# Patient Record
Sex: Female | Born: 1990 | Race: White | Hispanic: No | Marital: Married | Smoking: Never smoker
Health system: Southern US, Community
[De-identification: ages and names within clinical notes are randomized; demographics above are authoritative.]

## PROBLEM LIST (undated history)

## (undated) DIAGNOSIS — K219 Gastro-esophageal reflux disease without esophagitis: Secondary | ICD-10-CM

## (undated) DIAGNOSIS — E079 Disorder of thyroid, unspecified: Secondary | ICD-10-CM

## (undated) DIAGNOSIS — E039 Hypothyroidism, unspecified: Secondary | ICD-10-CM

## (undated) DIAGNOSIS — I10 Essential (primary) hypertension: Secondary | ICD-10-CM

## (undated) HISTORY — DX: Disorder of thyroid, unspecified: E07.9

## (undated) HISTORY — PX: TONSILLECTOMY: SUR1361

## (undated) HISTORY — DX: Essential (primary) hypertension: I10

## (undated) HISTORY — PX: WISDOM TOOTH EXTRACTION: SHX21

---

## 2014-03-22 ENCOUNTER — Inpatient Hospital Stay: Payer: Self-pay | Admitting: Obstetrics and Gynecology

## 2015-04-11 DIAGNOSIS — Z124 Encounter for screening for malignant neoplasm of cervix: Secondary | ICD-10-CM | POA: Diagnosis not present

## 2015-04-11 DIAGNOSIS — Z309 Encounter for contraceptive management, unspecified: Secondary | ICD-10-CM | POA: Diagnosis not present

## 2015-04-11 DIAGNOSIS — J0100 Acute maxillary sinusitis, unspecified: Secondary | ICD-10-CM | POA: Diagnosis not present

## 2015-04-11 DIAGNOSIS — Z0001 Encounter for general adult medical examination with abnormal findings: Secondary | ICD-10-CM | POA: Diagnosis not present

## 2015-04-24 DIAGNOSIS — R300 Dysuria: Secondary | ICD-10-CM | POA: Diagnosis not present

## 2015-04-24 DIAGNOSIS — N390 Urinary tract infection, site not specified: Secondary | ICD-10-CM | POA: Diagnosis not present

## 2015-04-24 DIAGNOSIS — R87820 Cervical low risk human papillomavirus (HPV) DNA test positive: Secondary | ICD-10-CM | POA: Diagnosis not present

## 2015-11-10 DIAGNOSIS — R10817 Generalized abdominal tenderness: Secondary | ICD-10-CM | POA: Diagnosis not present

## 2015-11-10 DIAGNOSIS — K219 Gastro-esophageal reflux disease without esophagitis: Secondary | ICD-10-CM | POA: Diagnosis not present

## 2015-11-20 ENCOUNTER — Other Ambulatory Visit: Payer: Self-pay | Admitting: Nurse Practitioner

## 2015-11-20 ENCOUNTER — Ambulatory Visit
Admission: RE | Admit: 2015-11-20 | Discharge: 2015-11-20 | Disposition: A | Discharge status: Home / Self Care | Payer: BLUE CROSS/BLUE SHIELD | Source: Ambulatory Visit | Attending: Nurse Practitioner | Admitting: Nurse Practitioner

## 2015-11-20 DIAGNOSIS — R10817 Generalized abdominal tenderness: Secondary | ICD-10-CM | POA: Diagnosis not present

## 2015-11-20 DIAGNOSIS — K8020 Calculus of gallbladder without cholecystitis without obstruction: Secondary | ICD-10-CM | POA: Diagnosis not present

## 2015-11-24 ENCOUNTER — Encounter: Payer: Self-pay | Admitting: General Surgery

## 2015-11-24 ENCOUNTER — Ambulatory Visit (INDEPENDENT_AMBULATORY_CARE_PROVIDER_SITE_OTHER): Payer: BLUE CROSS/BLUE SHIELD | Admitting: General Surgery

## 2015-11-24 VITALS — BP 120/72 | HR 80 | Resp 12 | Ht 65.0 in | Wt 190.0 lb

## 2015-11-24 DIAGNOSIS — K8020 Calculus of gallbladder without cholecystitis without obstruction: Secondary | ICD-10-CM

## 2015-11-24 NOTE — Patient Instructions (Signed)
Laparoscopic Cholecystectomy Laparoscopic cholecystectomy is surgery to remove the gallbladder. The gallbladder is a pear-shaped organ that lies beneath the liver on the right side of the body. The gallbladder stores bile, which is a fluid that helps the body to digest fats. Cholecystectomy is often done for inflammation of the gallbladder (cholecystitis). This condition is usually caused by a buildup of gallstones (cholelithiasis) in the gallbladder. Gallstones can block the flow of bile, which can result in inflammation and pain. In severe cases, emergency surgery may be required. This procedure is done though small incisions in your abdomen (laparoscopic surgery). A thin scope with a camera (laparoscope) is inserted through one incision. Thin surgical instruments are inserted through the other incisions. In some cases, a laparoscopic procedure may be turned into a type of surgery that is done through a larger incision (open surgery). Tell a health care provider about:  Any allergies you have.  All medicines you are taking, including vitamins, herbs, eye drops, creams, and over-the-counter medicines.  Any problems you or family members have had with anesthetic medicines.  Any blood disorders you have.  Any surgeries you have had.  Any medical conditions you have.  Whether you are pregnant or may be pregnant. What are the risks? Generally, this is a safe procedure. However, problems may occur, including:  Infection.  Bleeding.  Allergic reactions to medicines.  Damage to other structures or organs.  A stone remaining in the common bile duct. The common bile duct carries bile from the gallbladder into the small intestine.  A bile leak from the cyst duct that is clipped when your gallbladder is removed. What happens before the procedure? Staying hydrated  Follow instructions from your health care provider about hydration, which may include:  Up to 2 hours before the procedure - you  may continue to drink clear liquids, such as water, clear fruit juice, black coffee, and plain tea. Eating and drinking restrictions  Follow instructions from your health care provider about eating and drinking, which may include:  8 hours before the procedure - stop eating heavy meals or foods such as meat, fried foods, or fatty foods.  6 hours before the procedure - stop eating light meals or foods, such as toast or cereal.  6 hours before the procedure - stop drinking milk or drinks that contain milk.  2 hours before the procedure - stop drinking clear liquids. Medicines   Ask your health care provider about:  Changing or stopping your regular medicines. This is especially important if you are taking diabetes medicines or blood thinners.  Taking medicines such as aspirin and ibuprofen. These medicines can thin your blood. Do not take these medicines before your procedure if your health care provider instructs you not to.  You may be given antibiotic medicine to help prevent infection. General instructions   Let your health care provider know if you develop a cold or an infection before surgery.  Plan to have someone take you home from the hospital or clinic.  Ask your health care provider how your surgical site will be marked or identified. What happens during the procedure?  To reduce your risk of infection:  Your health care team will wash or sanitize their hands.  Your skin will be washed with soap.  Hair may be removed from the surgical area.  An IV tube may be inserted into one of your veins.  You will be given one or more of the following:  A medicine to help you relax (  sedative).  A medicine to make you fall asleep (general anesthetic).  A breathing tube will be placed in your mouth.  Your surgeon will make several small cuts (incisions) in your abdomen.  The laparoscope will be inserted through one of the small incisions. The camera on the laparoscope will  send images to a TV screen (monitor) in the operating room. This lets your surgeon see inside your abdomen.  Air-like gas will be pumped into your abdomen. This will expand your abdomen to give the surgeon more room to perform the surgery.  Other tools that are needed for the procedure will be inserted through the other incisions. The gallbladder will be removed through one of the incisions.  Your common bile duct may be examined. If stones are found in the common bile duct, they may be removed.  After your gallbladder has been removed, the incisions will be closed with stitches (sutures), staples, or skin glue.  Your incisions may be covered with a bandage (dressing). The procedure may vary among health care providers and hospitals. What happens after the procedure?  Your blood pressure, heart rate, breathing rate, and blood oxygen level will be monitored until the medicines you were given have worn off.  You will be given medicines as needed to control your pain.  Do not drive for 24 hours if you were given a sedative. This information is not intended to replace advice given to you by your health care provider. Make sure you discuss any questions you have with your health care provider. Document Released: 12/21/2004 Document Revised: 07/13/2015 Document Reviewed: 06/09/2015 Elsevier Interactive Patient Education  2017 [employer]  

## 2015-11-24 NOTE — Progress Notes (Signed)
Patient ID: Miranda Briggs, female   DOB: [birth year], 25 y.o.   MRN: [id number]  Chief Complaint  Patient presents with  . Abdominal Pain    gallstones    HPI Miranda Briggs is a 25 y.o. female here for evaluation of gallstones. She had an ultrasound on 11/20/15. She reports that initially felt pain under her ribs, worse on right side radiating to the back. Abdominal pain is worse after eating fast food. She has had at least 3  flares in the past week. The attacks last up5hrs. She also nausea and vomiting associated with the pain I have reviewed the history of present illness with the patient.  HPI  History reviewed. No pertinent past medical history.  Past Surgical History:  Procedure Laterality Date  . TONSILLECTOMY      No family history on file.  Social History Social History  Substance Use Topics  . Smoking status: Never Smoker  . Smokeless tobacco: Never Used  . Alcohol use No    Allergies  Allergen Reactions  . Penicillins Hives    Current Outpatient Prescriptions  Medication Sig Dispense Refill  . LORYNA 3-0.02 MG tablet Take 1 tablet by mouth daily.  11   No current facility-administered medications for this visit.     Review of Systems Review of Systems  Constitutional: Negative.   Respiratory: Negative.   Cardiovascular: Negative.     Blood pressure 120/72, pulse 80, resp. rate 12, height 5\' 5"  (1.651 m), weight 190 lb (86.2 kg), last menstrual period 10/24/2015.  Physical Exam Physical Exam  Constitutional: She is oriented to person, place, and time. She appears well-developed and well-nourished.  HENT:  Mouth/Throat: Oropharynx is clear and moist.  Eyes: Conjunctivae are normal. No scleral icterus.  Neck: Neck supple.  Cardiovascular: Normal rate, regular rhythm and normal heart sounds.   Pulmonary/Chest: Effort normal and breath sounds normal.  Abdominal: Soft. Normal appearance and bowel sounds are normal. There is no hepatomegaly. There is  no tenderness.  Lymphadenopathy:    She has no cervical adenopathy.  Neurological: She is alert and oriented to person, place, and time.  Skin: Skin is warm and dry.    Data Reviewed Ultrasound showing multiple gallstones Assessment    Biliary colic due to cholelithiasis    Plan Recommended cholecystectomy-reasons, risks  and benefits explained  Patient is agreeable to laparoscopic cholecystectomy Labs ordered  Laparoscopic Cholecystectomy with Intraoperative Cholangiogram. The procedure, including it's potential risks and complications (including but not limited to infection, bleeding, injury to intra-abdominal organs or bile ducts, bile leak, poor cosmetic result, sepsis and death) were discussed with the patient in detail. Non-operative options, including their inherent risks (acute calculous cholecystitis with possible choledocholithiasis or gallstone pancreatitis, with the risk of ascending cholangitis, sepsis, and death) were discussed as well. The patient expressed and understanding of what we discussed and wishes to proceed with laparoscopic cholecystectomy. The patient further understands that if it is technically not possible, or it is unsafe to proceed laparoscopically, that I will convert to an open cholecystectomy.  Patient's surgery has been scheduled for 12-03-15 at [facility].    This has been scribed by Lesly Rubenstein LPN    [facility] G 11/25/2015, 5:55 AM

## 2015-11-25 ENCOUNTER — Encounter: Payer: Self-pay | Admitting: General Surgery

## 2015-11-25 LAB — CBC WITH DIFFERENTIAL/PLATELET
Basophils Absolute: 0 10*3/uL (ref 0.0–0.2)
Basos: 1 %
EOS (ABSOLUTE): 0.2 10*3/uL (ref 0.0–0.4)
EOS: 3 %
HEMATOCRIT: 36.5 % (ref 34.0–46.6)
HEMOGLOBIN: 13 g/dL (ref 11.1–15.9)
IMMATURE GRANULOCYTES: 0 %
Immature Grans (Abs): 0 10*3/uL (ref 0.0–0.1)
LYMPHS: 44 %
Lymphocytes Absolute: 3.7 10*3/uL — ABNORMAL HIGH (ref 0.7–3.1)
MCH: 31 pg (ref 26.6–33.0)
MCHC: 35.6 g/dL (ref 31.5–35.7)
MCV: 87 fL (ref 79–97)
MONOCYTES: 8 %
Monocytes Absolute: 0.6 10*3/uL (ref 0.1–0.9)
NEUTROS ABS: 3.5 10*3/uL (ref 1.4–7.0)
NEUTROS PCT: 44 %
PLATELETS: 359 10*3/uL (ref 150–379)
RBC: 4.2 x10E6/uL (ref 3.77–5.28)
RDW: 12.7 % (ref 12.3–15.4)
WBC: 8 10*3/uL (ref 3.4–10.8)

## 2015-11-25 LAB — LIPASE: Lipase: 40 U/L (ref 14–72)

## 2015-11-25 LAB — HEPATIC FUNCTION PANEL
ALBUMIN: 4.4 g/dL (ref 3.5–5.5)
ALT: 19 IU/L (ref 0–32)
AST: 17 IU/L (ref 0–40)
Alkaline Phosphatase: 68 IU/L (ref 39–117)
BILIRUBIN TOTAL: 0.7 mg/dL (ref 0.0–1.2)
BILIRUBIN, DIRECT: 0.19 mg/dL (ref 0.00–0.40)
Total Protein: 7.1 g/dL (ref 6.0–8.5)

## 2015-12-01 ENCOUNTER — Encounter
Admission: RE | Admit: 2015-12-01 | Discharge: 2015-12-01 | Disposition: A | Discharge status: Home / Self Care | Payer: BLUE CROSS/BLUE SHIELD | Source: Ambulatory Visit | Attending: General Surgery | Admitting: General Surgery

## 2015-12-01 HISTORY — DX: Gastro-esophageal reflux disease without esophagitis: K21.9

## 2015-12-01 NOTE — Patient Instructions (Signed)
  Your procedure is scheduled on: 12-03-15 Report to Same Day Surgery 2nd floor medical mall [facility] Entrance-take elevator on left to 2nd floor.  Check in with surgery information desk.) To find out your arrival time please call [phone] between 1PM - 3PM on 12-02-15  Remember: Instructions that are not followed completely may result in serious medical risk, up to and including death, or upon the discretion of your surgeon and anesthesiologist your surgery may need to be rescheduled.    _x___ 1. Do not eat food or drink liquids after midnight. No gum chewing or hard candies.     __x__ 2. No Alcohol for 24 hours before or after surgery.   __x__3. No Smoking for 24 prior to surgery.   ____  4. Bring all medications with you on the day of surgery if instructed.    __x__ 5. Notify your doctor if there is any change in your medical condition     (cold, fever, infections).     Do not wear jewelry, make-up, hairpins, clips or nail polish.  Do not wear lotions, powders, or perfumes. You may wear deodorant.  Do not shave 48 hours prior to surgery. Men may shave face and neck.  Do not bring valuables to the hospital.    [facility] is not responsible for any belongings or valuables.               Contacts, dentures or bridgework may not be worn into surgery.  Leave your suitcase in the car. After surgery it may be brought to your room.  For patients admitted to the hospital, discharge time is determined by your treatment team.   Patients discharged the day of surgery will not be allowed to drive home.  You will need someone to drive you home and stay with you the night of your procedure.    Please read over the following fact sheets that you were given:   [facility] Preparing for Surgery and or MRSA Information   _x___ Take these medicines the morning of surgery with A SIP OF WATER:    1. PRILOSEC  2.  3.  4.  5.  6.  ____Fleets enema or Magnesium Citrate as  directed.   ____ Use CHG Soap or sage wipes as directed on instruction sheet   ____ Use inhalers on the day of surgery and bring to hospital day of surgery  ____ Stop metformin 2 days prior to surgery    ____ Take 1/2 of usual insulin dose the night before surgery and none on the morning of  surgery.   ____ Stop Aspirin, Coumadin, Pllavix ,Eliquis, Effient, or Pradaxa  x__ Stop Anti-inflammatories such as Advil, Aleve, Ibuprofen, Motrin, Naproxen,          Naprosyn, Goodies powders or aspirin products-STOP ALEVE NOW-Ok to take Tylenol.   ____ Stop supplements until after surgery.    ____ Bring C-Pap to the hospital.

## 2015-12-03 ENCOUNTER — Encounter: Payer: Self-pay | Admitting: *Deleted

## 2015-12-03 ENCOUNTER — Ambulatory Visit: Payer: BLUE CROSS/BLUE SHIELD

## 2015-12-03 ENCOUNTER — Encounter
Admission: RE | Disposition: A | Discharge status: Home / Self Care | Payer: Self-pay | Source: Ambulatory Visit | Attending: General Surgery

## 2015-12-03 ENCOUNTER — Ambulatory Visit: Payer: BLUE CROSS/BLUE SHIELD | Admitting: Certified Registered"

## 2015-12-03 ENCOUNTER — Ambulatory Visit
Admission: RE | Admit: 2015-12-03 | Discharge: 2015-12-03 | Disposition: A | Discharge status: Home / Self Care | Payer: BLUE CROSS/BLUE SHIELD | Source: Ambulatory Visit | Attending: General Surgery | Admitting: General Surgery

## 2015-12-03 DIAGNOSIS — Z419 Encounter for procedure for purposes other than remedying health state, unspecified: Secondary | ICD-10-CM

## 2015-12-03 DIAGNOSIS — Z880 Allergy status to penicillin: Secondary | ICD-10-CM | POA: Diagnosis not present

## 2015-12-03 DIAGNOSIS — K8020 Calculus of gallbladder without cholecystitis without obstruction: Secondary | ICD-10-CM

## 2015-12-03 DIAGNOSIS — K219 Gastro-esophageal reflux disease without esophagitis: Secondary | ICD-10-CM | POA: Insufficient documentation

## 2015-12-03 DIAGNOSIS — K8064 Calculus of gallbladder and bile duct with chronic cholecystitis without obstruction: Secondary | ICD-10-CM | POA: Insufficient documentation

## 2015-12-03 DIAGNOSIS — K8070 Calculus of gallbladder and bile duct without cholecystitis without obstruction: Secondary | ICD-10-CM | POA: Diagnosis not present

## 2015-12-03 DIAGNOSIS — K8010 Calculus of gallbladder with chronic cholecystitis without obstruction: Secondary | ICD-10-CM | POA: Diagnosis not present

## 2015-12-03 HISTORY — PX: CHOLECYSTECTOMY: SHX55

## 2015-12-03 LAB — POCT PREGNANCY, URINE: Preg Test, Ur: NEGATIVE

## 2015-12-03 SURGERY — LAPAROSCOPIC CHOLECYSTECTOMY WITH INTRAOPERATIVE CHOLANGIOGRAM
Anesthesia: General | Wound class: Clean Contaminated

## 2015-12-03 MED ORDER — CHLORHEXIDINE GLUCONATE CLOTH 2 % EX PADS
6.0000 | MEDICATED_PAD | Freq: Once | CUTANEOUS | Status: DC
Start: 2015-12-03 — End: 2015-12-03

## 2015-12-03 MED ORDER — CEFAZOLIN SODIUM-DEXTROSE 2-4 GM/100ML-% IV SOLN
2.0000 g | INTRAVENOUS | Status: AC
  Administered 2015-12-03: 2 g via INTRAVENOUS

## 2015-12-03 MED ORDER — ACETAMINOPHEN 10 MG/ML IV SOLN
INTRAVENOUS | Status: DC | PRN
  Administered 2015-12-03: 1000 mg via INTRAVENOUS

## 2015-12-03 MED ORDER — SODIUM CHLORIDE 0.9 % IJ SOLN
INTRAMUSCULAR | Status: AC
  Filled 2015-12-03: qty 50

## 2015-12-03 MED ORDER — FENTANYL CITRATE (PF) 100 MCG/2ML IJ SOLN
INTRAMUSCULAR | Status: DC | PRN
  Administered 2015-12-03: 50 ug via INTRAVENOUS
  Administered 2015-12-03 (×2): 25 ug via INTRAVENOUS
  Administered 2015-12-03: 50 ug via INTRAVENOUS
  Administered 2015-12-03: 100 ug via INTRAVENOUS

## 2015-12-03 MED ORDER — LIDOCAINE HCL (CARDIAC) 20 MG/ML IV SOLN
INTRAVENOUS | Status: DC | PRN
  Administered 2015-12-03: 100 mg via INTRAVENOUS

## 2015-12-03 MED ORDER — SODIUM CHLORIDE FLUSH 0.9 % IV SOLN
INTRAVENOUS | Status: AC
  Filled 2015-12-03: qty 3

## 2015-12-03 MED ORDER — PROPOFOL 10 MG/ML IV BOLUS
INTRAVENOUS | Status: DC | PRN
Start: 2015-12-03 — End: 2015-12-03
  Administered 2015-12-03: 170 mg via INTRAVENOUS

## 2015-12-03 MED ORDER — OXYCODONE-ACETAMINOPHEN 5-325 MG PO TABS
1.0000 | ORAL_TABLET | ORAL | 0 refills | Status: DC | PRN
Start: 2015-12-03 — End: 2017-09-16

## 2015-12-03 MED ORDER — NEOSTIGMINE METHYLSULFATE 10 MG/10ML IV SOLN
INTRAVENOUS | Status: DC | PRN
  Administered 2015-12-03: 5 mg via INTRAVENOUS

## 2015-12-03 MED ORDER — ROCURONIUM BROMIDE 100 MG/10ML IV SOLN
INTRAVENOUS | Status: DC | PRN
  Administered 2015-12-03: 40 mg via INTRAVENOUS
  Administered 2015-12-03: 5 mg via INTRAVENOUS

## 2015-12-03 MED ORDER — FENTANYL CITRATE (PF) 100 MCG/2ML IJ SOLN
INTRAMUSCULAR | Status: AC
  Filled 2015-12-03: qty 2

## 2015-12-03 MED ORDER — FENTANYL CITRATE (PF) 100 MCG/2ML IJ SOLN
25.0000 ug | INTRAMUSCULAR | Status: DC | PRN
  Administered 2015-12-03: 25 ug via INTRAVENOUS

## 2015-12-03 MED ORDER — ONDANSETRON HCL 4 MG/2ML IJ SOLN
4.0000 mg | Freq: Once | INTRAMUSCULAR | Status: AC | PRN
  Administered 2015-12-03: 4 mg via INTRAVENOUS

## 2015-12-03 MED ORDER — DEXAMETHASONE SODIUM PHOSPHATE 10 MG/ML IJ SOLN
INTRAMUSCULAR | Status: DC | PRN
  Administered 2015-12-03: 4 mg via INTRAVENOUS

## 2015-12-03 MED ORDER — GLYCOPYRROLATE 0.2 MG/ML IJ SOLN
INTRAMUSCULAR | Status: DC | PRN
  Administered 2015-12-03: 1 mg via INTRAVENOUS

## 2015-12-03 MED ORDER — ONDANSETRON HCL 4 MG/2ML IJ SOLN
INTRAMUSCULAR | Status: AC
  Administered 2015-12-03: 4 mg via INTRAVENOUS
  Filled 2015-12-03: qty 2

## 2015-12-03 MED ORDER — ACETAMINOPHEN 10 MG/ML IV SOLN
INTRAVENOUS | Status: AC
  Filled 2015-12-03: qty 100

## 2015-12-03 MED ORDER — LACTATED RINGERS IV SOLN
INTRAVENOUS | Status: DC
  Administered 2015-12-03: 11:00:00 via INTRAVENOUS

## 2015-12-03 MED ORDER — ONDANSETRON HCL 4 MG/2ML IJ SOLN
INTRAMUSCULAR | Status: DC | PRN
  Administered 2015-12-03: 4 mg via INTRAVENOUS

## 2015-12-03 MED ORDER — CEFAZOLIN SODIUM-DEXTROSE 2-4 GM/100ML-% IV SOLN
INTRAVENOUS | Status: AC
  Filled 2015-12-03: qty 100

## 2015-12-03 MED ORDER — SODIUM CHLORIDE 0.9 % IV SOLN
INTRAVENOUS | Status: DC | PRN
  Administered 2015-12-03: 6 mL

## 2015-12-03 MED ORDER — KETOROLAC TROMETHAMINE 30 MG/ML IJ SOLN
INTRAMUSCULAR | Status: DC | PRN
  Administered 2015-12-03: 30 mg via INTRAVENOUS

## 2015-12-03 MED ORDER — MIDAZOLAM HCL 2 MG/2ML IJ SOLN
INTRAMUSCULAR | Status: DC | PRN
  Administered 2015-12-03: 2 mg via INTRAVENOUS

## 2015-12-03 SURGICAL SUPPLY — 39 items
ANCHOR TIS RET SYS 235ML (MISCELLANEOUS) ×2 IMPLANT
APPLICATOR SURGIFLO (MISCELLANEOUS) IMPLANT
APPLIER CLIP LOGIC TI 5 (MISCELLANEOUS) ×2 IMPLANT
BENZOIN TINCTURE PRP APPL 2/3 (GAUZE/BANDAGES/DRESSINGS) ×2 IMPLANT
BLADE SURG 11 STRL SS SAFETY (MISCELLANEOUS) ×2 IMPLANT
CANISTER SUCT 1200ML W/VALVE (MISCELLANEOUS) ×2 IMPLANT
CANNULA DILATOR 10 W/SLV (CANNULA) ×2 IMPLANT
CATH CHOLANG 76X19 KUMAR (CATHETERS) ×2 IMPLANT
CHLORAPREP W/TINT 26ML (MISCELLANEOUS) ×2 IMPLANT
DEFOGGER SCOPE WARMER CLEARIFY (MISCELLANEOUS) ×2 IMPLANT
DRAPE C-ARM XRAY 36X54 (DRAPES) ×2 IMPLANT
DRAPE INCISE IOBAN 66X45 STRL (DRAPES) ×2 IMPLANT
DRESSING TELFA 4X3 1S ST N-ADH (GAUZE/BANDAGES/DRESSINGS) ×2 IMPLANT
DRSG TEGADERM 2-3/8X2-3/4 SM (GAUZE/BANDAGES/DRESSINGS) ×2 IMPLANT
ELECT REM PT RETURN 9FT ADLT (ELECTROSURGICAL) ×2
ELECTRODE REM PT RTRN 9FT ADLT (ELECTROSURGICAL) ×1 IMPLANT
GLOVE BIO SURGEON STRL SZ7 (GLOVE) ×8 IMPLANT
GOWN STRL REUS W/ TWL LRG LVL3 (GOWN DISPOSABLE) ×3 IMPLANT
GOWN STRL REUS W/TWL LRG LVL3 (GOWN DISPOSABLE) ×3
GRASPER SUT TROCAR 14GX15 (MISCELLANEOUS) ×2 IMPLANT
HEMOSTAT SURGICEL 2X3 (HEMOSTASIS) IMPLANT
IRRIGATION STRYKERFLOW (MISCELLANEOUS) ×1 IMPLANT
IRRIGATOR STRYKERFLOW (MISCELLANEOUS) ×2
IV LACTATED RINGERS 1000ML (IV SOLUTION) ×2 IMPLANT
KIT RM TURNOVER STRD PROC AR (KITS) ×2 IMPLANT
LABEL OR SOLS (LABEL) ×2 IMPLANT
NDL INSUFF ACCESS 14 VERSASTEP (NEEDLE) ×2 IMPLANT
PACK LAP CHOLECYSTECTOMY (MISCELLANEOUS) ×2 IMPLANT
SCISSORS METZENBAUM CVD 33 (INSTRUMENTS) ×2 IMPLANT
SLEEVE ENDOPATH XCEL 5M (ENDOMECHANICALS) ×4 IMPLANT
SPOGE SURGIFLO 8M (HEMOSTASIS)
SPONGE SURGIFLO 8M (HEMOSTASIS) IMPLANT
STRIP CLOSURE SKIN 1/2X4 (GAUZE/BANDAGES/DRESSINGS) ×2 IMPLANT
STRIP CLOSURE SKIN 1/4X4 (GAUZE/BANDAGES/DRESSINGS) ×2 IMPLANT
SUT VIC AB 0 SH 27 (SUTURE) ×2 IMPLANT
SUT VIC AB 4-0 FS2 27 (SUTURE) ×4 IMPLANT
SWABSTK COMLB BENZOIN TINCTURE (MISCELLANEOUS) ×2 IMPLANT
TROCAR XCEL NON-BLD 5MMX100MML (ENDOMECHANICALS) ×2 IMPLANT
TUBING INSUFFLATOR HI FLOW (MISCELLANEOUS) ×2 IMPLANT

## 2015-12-03 NOTE — Transfer of Care (Signed)
Immediate Anesthesia Transfer of Care Note  Patient: Miranda Briggs  Procedure(s) Performed: Procedure(s): LAPAROSCOPIC CHOLECYSTECTOMY WITH INTRAOPERATIVE CHOLANGIOGRAM (N/A)  Patient Location: PACU  Anesthesia Type:General  Level of Consciousness: sedated  Airway & Oxygen Therapy: Patient Spontanous Breathing and Patient connected to face mask oxygen  Post-op Assessment: Report given to RN and Post -op Vital signs reviewed and stable  Post vital signs: Reviewed and stable  Last Vitals:  Vitals:   12/03/15 1055 12/03/15 1423  BP: (!) 143/95 (!) 128/92  Pulse: 99 73  Resp: 18   Temp: 36.2 C     Last Pain:  Vitals:   12/03/15 1055  TempSrc: Tympanic         Complications: No apparent anesthesia complications

## 2015-12-03 NOTE — Discharge Instructions (Signed)

## 2015-12-03 NOTE — Progress Notes (Signed)
Clarified order for ancef with dr Jamal Collin. Allergy to PCN, ancef 2gm ordered. OK to proceed with ancef 2gm.

## 2015-12-03 NOTE — H&P (View-Only) (Signed)
Patient ID: Miranda Briggs, female   DOB: 02-Jun-1990, 25 y.o.   MRN: [id number]  Chief Complaint  Patient presents with  . Abdominal Pain    gallstones    HPI Miranda Briggs is a 25 y.o. female here for evaluation of gallstones. She had an ultrasound on 11/20/15. She reports that initially felt pain under her ribs, worse on right side radiating to the back. Abdominal pain is worse after eating fast food. She has had at least 3  flares in the past week. The attacks last up5hrs. She also nausea and vomiting associated with the pain I have reviewed the history of present illness with the patient.  HPI  History reviewed. No pertinent past medical history.  Past Surgical History:  Procedure Laterality Date  . TONSILLECTOMY      No family history on file.  Social History Social History  Substance Use Topics  . Smoking status: Never Smoker  . Smokeless tobacco: Never Used  . Alcohol use No    Allergies  Allergen Reactions  . Penicillins Hives    Current Outpatient Prescriptions  Medication Sig Dispense Refill  . LORYNA 3-0.02 MG tablet Take 1 tablet by mouth daily.  11   No current facility-administered medications for this visit.     Review of Systems Review of Systems  Constitutional: Negative.   Respiratory: Negative.   Cardiovascular: Negative.     Blood pressure 120/72, pulse 80, resp. rate 12, height 5\' 5"  (1.651 m), weight 190 lb (86.2 kg), last menstrual period 10/24/2015.  Physical Exam Physical Exam  Constitutional: She is oriented to person, place, and time. She appears well-developed and well-nourished.  HENT:  Mouth/Throat: Oropharynx is clear and moist.  Eyes: Conjunctivae are normal. No scleral icterus.  Neck: Neck supple.  Cardiovascular: Normal rate, regular rhythm and normal heart sounds.   Pulmonary/Chest: Effort normal and breath sounds normal.  Abdominal: Soft. Normal appearance and bowel sounds are normal. There is no hepatomegaly. There is  no tenderness.  Lymphadenopathy:    She has no cervical adenopathy.  Neurological: She is alert and oriented to person, place, and time.  Skin: Skin is warm and dry.    Data Reviewed Ultrasound showing multiple gallstones Assessment    Biliary colic due to cholelithiasis    Plan Recommended cholecystectomy-reasons, risks  and benefits explained  Patient is agreeable to laparoscopic cholecystectomy Labs ordered  Laparoscopic Cholecystectomy with Intraoperative Cholangiogram. The procedure, including it's potential risks and complications (including but not limited to infection, bleeding, injury to intra-abdominal organs or bile ducts, bile leak, poor cosmetic result, sepsis and death) were discussed with the patient in detail. Non-operative options, including their inherent risks (acute calculous cholecystitis with possible choledocholithiasis or gallstone pancreatitis, with the risk of ascending cholangitis, sepsis, and death) were discussed as well. The patient expressed and understanding of what we discussed and wishes to proceed with laparoscopic cholecystectomy. The patient further understands that if it is technically not possible, or it is unsafe to proceed laparoscopically, that I will convert to an open cholecystectomy.  Patient's surgery has been scheduled for 12-03-15 at [facility].    This has been scribed by Lesly Rubenstein LPN    [facility] G 11/25/2015, 5:55 AM

## 2015-12-03 NOTE — Op Note (Signed)
Preop diagnosis: Cholelithiasis  Post op diagnosis: Same  Operation: Laparoscopy cholecystectomy with intraoperative cholangiogram  Surgeon: Barry Brunner  Assistant:     Anesthesia: Gen.  Complications: None  EBL: Less than 20 mL  Drains: None  Description: Patient was put to sleep in supine position the operating table. Abdomen was prepped and draped as sterile field and timeout performed. Initial port incision was made along the inferior lip of the umbilicus. This was dissected down to the fascia and with careful exposure Veress needle with the InnerDyne sleeve was placed in the peritoneal cavity through the fascia and position verified of the hanging drop method. Pneumoperitoneum was obtained followed by placement of a 10 mm port. With camera in place epigastric and 2 lateral 5 mm ports were placed. There was no apparent injury to the underlying structures from the initial entry noted. Gallbladder was somewhat tubular in nature and fairly long with thickening in its wall but no acute changes or adhesions. With careful traction cephalad the Hartman's pouch was pulled up. The anatomy was well outlined with the cystic duct common bile duct and the cystic artery and cystic duct node all visualized easily. The cystic duct was first freed and the Kumar clamp and catheter were positioned. Cholangiogram was completed showing normal-appearing bile duct both proximal and distal with no obstruction to flow. Catheter was then removed from the cystic duct hemoclipped and cut. The cystic artery was then freed hemoclipped and cut. Gallbladder was then dissected off the gallbladder bed using cautery for control of bleeding. After gallbladder was freed the area was inspected and irrigated a small amount of fluid and fluid suctioned out. The clips were all noted be intact and in place. The gallbladder was then placed in a retrieval bag and brought out through the umbilical port site. It contained some impacted  stones and some free ones. The  fascial opening at the umbilicus was closed with a state of stitch of 0 Vicryl placed using a suture passer. Pneumoperitoneum was released and the remaining ports removed. All skin incisions were closed were were closed with subcuticular 4-0 Vicryl reinforced with Steri-Strips and tincture benzoin. Telfa and Tegaderm dressings were placed. Patient was subsequently extubated and returned recovery room in stable condition

## 2015-12-03 NOTE — Anesthesia Procedure Notes (Signed)
Procedure Name: Intubation Performed by: Coston Mandato Pre-anesthesia Checklist: Patient identified, Patient being monitored, Timeout performed, Emergency Drugs available and Suction available Patient Re-evaluated:Patient Re-evaluated prior to inductionOxygen Delivery Method: Circle system utilized Preoxygenation: Pre-oxygenation with 100% oxygen Intubation Type: IV induction Ventilation: Mask ventilation without difficulty Laryngoscope Size: Mac and 3 Grade View: Grade I Tube type: Oral Tube size: 7.0 mm Number of attempts: 1 Airway Equipment and Method: Stylet Placement Confirmation: ETT inserted through vocal cords under direct vision,  positive ETCO2 and breath sounds checked- equal and bilateral Secured at: 21 cm Tube secured with: Tape Dental Injury: Teeth and Oropharynx as per pre-operative assessment        

## 2015-12-03 NOTE — Anesthesia Postprocedure Evaluation (Signed)
Anesthesia Post Note  Patient: Belvie Delles  Procedure(s) Performed: Procedure(s) (LRB): LAPAROSCOPIC CHOLECYSTECTOMY WITH INTRAOPERATIVE CHOLANGIOGRAM (N/A)  Patient location during evaluation: PACU Anesthesia Type: General Level of consciousness: awake Pain management: pain level controlled Vital Signs Assessment: post-procedure vital signs reviewed and stable Respiratory status: spontaneous breathing Cardiovascular status: stable Anesthetic complications: no    Last Vitals:  Vitals:   12/03/15 1438 12/03/15 1453  BP: 130/90 (!) 130/91  Pulse: (!) 109 86  Resp: 12 10  Temp:      Last Pain:  Vitals:   12/03/15 1453  TempSrc:   PainSc: 3                  VAN STAVEREN,Valina Maes

## 2015-12-03 NOTE — Anesthesia Preprocedure Evaluation (Signed)
Anesthesia Evaluation  Patient identified by MRN, date of birth, ID band Patient awake    Reviewed: Allergy & Precautions, NPO status , Patient's Chart, lab work & pertinent test results  Airway Mallampati: II       Dental  (+) Teeth Intact   Pulmonary neg pulmonary ROS,    breath sounds clear to auscultation       Cardiovascular Exercise Tolerance: Good  Rhythm:Regular Rate:Normal     Neuro/Psych negative neurological ROS     GI/Hepatic Neg liver ROS, GERD  Medicated,  Endo/Other  negative endocrine ROS  Renal/GU negative Renal ROS     Musculoskeletal negative musculoskeletal ROS (+)   Abdominal Normal abdominal exam  (+)   Peds negative pediatric ROS (+)  Hematology negative hematology ROS (+)   Anesthesia Other Findings   Reproductive/Obstetrics                             Anesthesia Physical Anesthesia Plan  ASA: II  Anesthesia Plan: General   Post-op Pain Management:    Induction: Intravenous  Airway Management Planned: Oral ETT  Additional Equipment:   Intra-op Plan:   Post-operative Plan: Extubation in OR  Informed Consent: I have reviewed the patients History and Physical, chart, labs and discussed the procedure including the risks, benefits and alternatives for the proposed anesthesia with the patient or authorized representative who has indicated his/her understanding and acceptance.     Plan Discussed with: CRNA  Anesthesia Plan Comments:         Anesthesia Quick Evaluation

## 2015-12-03 NOTE — Interval H&P Note (Signed)
History and Physical Interval Note:  12/03/2015 11:43 AM  Miranda Briggs  has presented today for surgery, with the diagnosis of cholelithiasis  The various methods of treatment have been discussed with the patient and family. After consideration of risks, benefits and other options for treatment, the patient has consented to  Procedure(s): LAPAROSCOPIC CHOLECYSTECTOMY WITH INTRAOPERATIVE CHOLANGIOGRAM (N/A) as a surgical intervention .  The patient's history has been reviewed, patient examined, no change in status, stable for surgery.  I have reviewed the patient's chart and labs.  Questions were answered to the patient's satisfaction.     Zaid Tomes G

## 2015-12-04 ENCOUNTER — Encounter: Payer: Self-pay | Admitting: General Surgery

## 2015-12-05 LAB — SURGICAL PATHOLOGY

## 2015-12-11 ENCOUNTER — Encounter: Payer: Self-pay | Admitting: General Surgery

## 2015-12-11 ENCOUNTER — Ambulatory Visit (INDEPENDENT_AMBULATORY_CARE_PROVIDER_SITE_OTHER): Payer: BLUE CROSS/BLUE SHIELD | Admitting: General Surgery

## 2015-12-11 VITALS — BP 132/80 | HR 78 | Resp 12 | Ht 65.0 in | Wt 186.0 lb

## 2015-12-11 DIAGNOSIS — K8020 Calculus of gallbladder without cholecystitis without obstruction: Secondary | ICD-10-CM

## 2015-12-11 NOTE — Patient Instructions (Signed)
Return as needed

## 2015-12-11 NOTE — Progress Notes (Signed)
Patient ID: Adar Ferdon, female   DOB: February 28, 1990, 25 y.o.   MRN: [id number]  Chief Complaint  Patient presents with  . Routine Post Op    HPI Miranda Briggs is a 25 y.o. female here today for her post op cholecystectomy done on 12/03/2015. Patient states she is doing well.  I have reviewed the history of present illness with the patient.  HPI  Past Medical History:  Diagnosis Date  . GERD (gastroesophageal reflux disease)     Past Surgical History:  Procedure Laterality Date  . CHOLECYSTECTOMY N/A 12/03/2015   Procedure: LAPAROSCOPIC CHOLECYSTECTOMY WITH INTRAOPERATIVE CHOLANGIOGRAM;  Surgeon: Christene Lye, MD;  Location: [facility] ORS;  Service: General;  Laterality: N/A;  . TONSILLECTOMY      No family history on file.  Social History Social History  Substance Use Topics  . Smoking status: Never Smoker  . Smokeless tobacco: Never Used  . Alcohol use Yes     Comment: OCC WINE    Allergies  Allergen Reactions  . Penicillins Hives    Has patient had a PCN reaction causing immediate rash, facial/tongue/throat swelling, SOB or lightheadedness with hypotension:No Has patient had a PCN reaction causing severe rash involving mucus membranes or skin necrosis:No Has patient had a PCN reaction that required hospitalization:No Has patient had a PCN reaction occurring within the last 10 years:Yes If all of the above answers are "NO", then may proceed with Cephalosporin use.     Current Outpatient Prescriptions  Medication Sig Dispense Refill  . acetaminophen (TYLENOL) 325 MG tablet Take 650 mg by mouth every 6 (six) hours as needed (for pain.).    Marland Kitchen LORYNA 3-0.02 MG tablet Take 1 tablet by mouth daily.  11  . Multiple Vitamin (MULTIVITAMIN WITH MINERALS) TABS tablet Take 1 tablet by mouth every evening. One-A-Day    . naproxen sodium (ALEVE) 220 MG tablet Take 440 mg by mouth 2 (two) times daily as needed (for pain).    Marland Kitchen omeprazole (PRILOSEC) 40 MG capsule Take 40  mg by mouth at bedtime.     Marland Kitchen oxyCODONE-acetaminophen (ROXICET) 5-325 MG tablet Take 1 tablet by mouth every 4 (four) hours as needed. 20 tablet 0   No current facility-administered medications for this visit.     Review of Systems Review of Systems  Constitutional: Negative.   Respiratory: Negative.     Blood pressure 132/80, pulse 78, resp. rate 12, height 5\' 5"  (1.651 m), weight 186 lb (84.4 kg), last menstrual period 12/01/2015.  Physical Exam Physical Exam  Constitutional: She is oriented to person, place, and time. She appears well-developed and well-nourished.  Cardiovascular: Normal rate, regular rhythm and normal heart sounds.   Pulmonary/Chest: Effort normal and breath sounds normal.  Abdominal: Soft. Normal appearance and bowel sounds are normal. There is no tenderness.  Port sites are clean and healing well.   Neurological: She is alert and oriented to person, place, and time.  Skin: Skin is warm and dry.    Data Reviewed Path- chronic cholecystitis, cholelithiasis  Assessment    Doing well post cholecystectomy.     Plan    Return as needed. No restrictions This information has been scribed by Gaspar Cola CMA.        SANKAR,SEEPLAPUTHUR G 12/12/2015, 10:40 AM

## 2016-09-09 DIAGNOSIS — Z0001 Encounter for general adult medical examination with abnormal findings: Secondary | ICD-10-CM | POA: Diagnosis not present

## 2016-09-09 DIAGNOSIS — Z124 Encounter for screening for malignant neoplasm of cervix: Secondary | ICD-10-CM | POA: Diagnosis not present

## 2016-09-09 DIAGNOSIS — E669 Obesity, unspecified: Secondary | ICD-10-CM | POA: Diagnosis not present

## 2016-09-09 DIAGNOSIS — R030 Elevated blood-pressure reading, without diagnosis of hypertension: Secondary | ICD-10-CM | POA: Diagnosis not present

## 2017-06-30 ENCOUNTER — Encounter: Payer: Self-pay | Admitting: Nurse Practitioner

## 2017-06-30 ENCOUNTER — Ambulatory Visit: Payer: BLUE CROSS/BLUE SHIELD | Admitting: Nurse Practitioner

## 2017-06-30 VITALS — BP 144/104 | HR 82 | Resp 16 | Ht 63.0 in | Wt 214.6 lb

## 2017-06-30 DIAGNOSIS — K219 Gastro-esophageal reflux disease without esophagitis: Secondary | ICD-10-CM | POA: Diagnosis not present

## 2017-06-30 DIAGNOSIS — R1084 Generalized abdominal pain: Secondary | ICD-10-CM

## 2017-06-30 DIAGNOSIS — K580 Irritable bowel syndrome with diarrhea: Secondary | ICD-10-CM

## 2017-06-30 LAB — POCT URINALYSIS DIPSTICK
Bilirubin, UA: NEGATIVE
GLUCOSE UA: NEGATIVE
KETONES UA: NEGATIVE
LEUKOCYTES UA: NEGATIVE
Nitrite, UA: NEGATIVE
Protein, UA: NEGATIVE
RBC UA: NEGATIVE
Urobilinogen, UA: 0.2 E.U./dL
pH, UA: 7 (ref 5.0–8.0)

## 2017-06-30 LAB — POCT URINE PREGNANCY: PREG TEST UR: NEGATIVE

## 2017-06-30 MED ORDER — DICYCLOMINE HCL 10 MG PO CAPS: 10.0000 mg | ORAL_CAPSULE | Freq: Three times a day (TID) | ORAL | 2 refills | Status: DC

## 2017-06-30 NOTE — Progress Notes (Signed)
[facility] [place], [place] [address]  Internal MEDICINE  Office Visit Note  Patient Name: Miranda Briggs  [id number]  [id number]  Date of Service: 07/01/2017   Pt is here for a sick visit.  Chief Complaint  Patient presents with  . GI Problem    been going on since after surgery     The patient had her gallbladder removed in November. Since then, she has gradually developed abdomnal pain with cramping. This is intermittent. Gradually becoming more frequent and more severe. Now occurring every 2 ot 3 days. This is made worse after eating .She develops belching, cramping, and most of the time, she also has several episodes of diarrhea. She cannot pinpoint specific foods. States that one day a certain food will be ok, the next time she eats it, will cause her the pain and cramping. She has no fever, chills, or headache.        Current Medication:  Outpatient Encounter Medications as of 06/30/2017  Medication Sig  . Multiple Vitamin (MULTIVITAMIN WITH MINERALS) TABS tablet Take 1 tablet by mouth every evening. One-A-Day  . acetaminophen (TYLENOL) 325 MG tablet Take 650 mg by mouth every 6 (six) hours as needed (for pain.).  Marland Kitchen dicyclomine (BENTYL) 10 MG capsule Take 1 capsule (10 mg total) by mouth 4 (four) times daily -  before meals and at bedtime.  Gwenlyn Saran 3-0.02 MG tablet Take 1 tablet by mouth daily.  . naproxen sodium (ALEVE) 220 MG tablet Take 440 mg by mouth 2 (two) times daily as needed (for pain).  Marland Kitchen omeprazole (PRILOSEC) 40 MG capsule Take 40 mg by mouth at bedtime.   Marland Kitchen oxyCODONE-acetaminophen (ROXICET) 5-325 MG tablet Take 1 tablet by mouth every 4 (four) hours as needed. (Patient not taking: Reported on 06/30/2017)   No facility-administered encounter medications on file as of 06/30/2017.       Medical History: Past Medical History:  Diagnosis Date  . GERD (gastroesophageal reflux disease)      Today's Vitals   06/30/17 1633  BP:  (!) 144/104  Pulse: 82  Resp: 16  SpO2: 96%  Weight: 214 lb 9.6 oz (97.3 kg)  Height: 5\' 3"  (1.6 m)    Review of Systems  Constitutional: Positive for appetite change and fatigue. Negative for chills and unexpected weight change.  HENT: Negative for congestion, postnasal drip, rhinorrhea, sneezing and sore throat.   Eyes: Negative.  Negative for redness.  Respiratory: Negative for cough, chest tightness, shortness of breath and wheezing.   Cardiovascular: Negative for chest pain and palpitations.  Gastrointestinal: Positive for abdominal pain and diarrhea. Negative for constipation, nausea and vomiting.  Endocrine: Negative for cold intolerance, heat intolerance, polydipsia, polyphagia and polyuria.  Genitourinary: Negative for dysuria and frequency.  Musculoskeletal: Negative for arthralgias, back pain, joint swelling and neck pain.  Skin: Negative for rash.       Single vertical line of dicoloration present in fingernail of the right middle finger.   Allergic/Immunologic: Negative for environmental allergies.  Neurological: Positive for headaches. Negative for tremors and numbness.  Hematological: Negative for adenopathy. Does not bruise/bleed easily.  Psychiatric/Behavioral: Negative for behavioral problems (Depression), sleep disturbance and suicidal ideas. The patient is not nervous/anxious.     Physical Exam  Constitutional: She is oriented to person, place, and time. She appears well-developed and well-nourished. No distress.  HENT:  Head: Normocephalic and atraumatic.  Mouth/Throat: Oropharynx is clear and moist. No oropharyngeal exudate.  Eyes: Pupils are equal, round,  and reactive to light. EOM are normal.  Neck: Normal range of motion. Neck supple. No JVD present. No tracheal deviation present. No thyromegaly present.  Cardiovascular: Normal rate, regular rhythm and normal heart sounds. Exam reveals no gallop and no friction rub.  No murmur heard. Pulmonary/Chest: Effort  normal and breath sounds normal. No respiratory distress. She has no wheezes. She has no rales. She exhibits no tenderness.  Abdominal: Soft. Bowel sounds are normal.  Very slight, generalized abdominal tenderness.   Genitourinary:  Genitourinary Comments: Urine pregnancy test negative. U/a negative for infection or other abnormalities.   Musculoskeletal: Normal range of motion.  Lymphadenopathy:    She has no cervical adenopathy.  Neurological: She is alert and oriented to person, place, and time. No cranial nerve deficit.  Skin: Skin is warm and dry. Capillary refill takes less than 2 seconds. She is not diaphoretic.  Psychiatric: She has a normal mood and affect. Her behavior is normal. Judgment and thought content normal.  Nursing note and vitals reviewed.  Assessment/Plan: 1. Generalized abdominal pain Urine sample negative for infection, hematuria or pregnancy. Will get CT abdomen and pelvis for further evaluation. -- CT Abdomen Pelvis W Contrast; Future - POCT Urinalysis Dipstick - POCT urine pregnancy  2. Irritable bowel syndrome with diarrhea  dicyclomine (BENTYL) 10 MG capsule; Take 1 capsule (10 mg total) by mouth 4 (four) times daily -  before meals and at bedtime.  Dispense: 90 capsule; Refill: 2 Recommend BRAT or Bland diet. Advance as tolerated.   3. Gastroesophageal reflux disease without esophagitis Continue omeprazole as prescribed.   General Counseling: Tmya verbalizes understanding of the findings of todays visit and agrees with plan of treatment. I have discussed any further diagnostic evaluation that may be needed or ordered today. We also reviewed her medications today. she has been encouraged to call the office with any questions or concerns that should arise related to todays visit.    Counseling:    Orders Placed This Encounter  Procedures  . CT Abdomen Pelvis W Contrast  . POCT Urinalysis Dipstick  . POCT urine pregnancy    Meds ordered this  encounter  Medications  . dicyclomine (BENTYL) 10 MG capsule    Sig: Take 1 capsule (10 mg total) by mouth 4 (four) times daily -  before meals and at bedtime.    Dispense:  90 capsule    Refill:  2    Order Specific Question:   Supervising Provider    Answer:   Lavera Guise [[id number]]   The 'BRAT' diet is suggested, then progress to diet as tolerated as symptoms abate. Call if bloody stools, persistent diarrhea, vomiting, fever or abdominal pain.  This patient was seen by Leretha Pol, FNP- C in Collaboration with Dr Lavera Guise as a part of collaborative care agreement  Time spent: 25 Minutes

## 2017-07-01 ENCOUNTER — Other Ambulatory Visit: Payer: Self-pay | Admitting: Nurse Practitioner

## 2017-07-01 DIAGNOSIS — R197 Diarrhea, unspecified: Secondary | ICD-10-CM | POA: Diagnosis not present

## 2017-07-01 DIAGNOSIS — R1084 Generalized abdominal pain: Secondary | ICD-10-CM | POA: Diagnosis not present

## 2017-07-01 DIAGNOSIS — K909 Intestinal malabsorption, unspecified: Secondary | ICD-10-CM | POA: Diagnosis not present

## 2017-07-01 DIAGNOSIS — K580 Irritable bowel syndrome with diarrhea: Secondary | ICD-10-CM | POA: Insufficient documentation

## 2017-07-01 DIAGNOSIS — K219 Gastro-esophageal reflux disease without esophagitis: Secondary | ICD-10-CM | POA: Insufficient documentation

## 2017-07-02 LAB — COMPREHENSIVE METABOLIC PANEL
A/G RATIO: 1.9 (ref 1.2–2.2)
ALT: 36 IU/L — AB (ref 0–32)
AST: 24 IU/L (ref 0–40)
Albumin: 4.6 g/dL (ref 3.5–5.5)
Alkaline Phosphatase: 75 IU/L (ref 39–117)
BILIRUBIN TOTAL: 0.7 mg/dL (ref 0.0–1.2)
BUN/Creatinine Ratio: 11 (ref 9–23)
BUN: 9 mg/dL (ref 6–20)
CALCIUM: 9.4 mg/dL (ref 8.7–10.2)
CHLORIDE: 101 mmol/L (ref 96–106)
CO2: 25 mmol/L (ref 20–29)
Creatinine, Ser: 0.81 mg/dL (ref 0.57–1.00)
GFR calc Af Amer: 114 mL/min/{1.73_m2} (ref >59)
GFR, EST NON AFRICAN AMERICAN: 99 mL/min/{1.73_m2} (ref >59)
Globulin, Total: 2.4 g/dL (ref 1.5–4.5)
Glucose: 85 mg/dL (ref 65–99)
POTASSIUM: 3.9 mmol/L (ref 3.5–5.2)
Sodium: 141 mmol/L (ref 134–144)
Total Protein: 7 g/dL (ref 6.0–8.5)

## 2017-07-02 LAB — TSH: TSH: 4.7 u[IU]/mL — AB (ref 0.450–4.500)

## 2017-07-02 LAB — CBC
HEMATOCRIT: 41.8 % (ref 34.0–46.6)
HEMOGLOBIN: 13.6 g/dL (ref 11.1–15.9)
MCH: 29.8 pg (ref 26.6–33.0)
MCHC: 32.5 g/dL (ref 31.5–35.7)
MCV: 92 fL (ref 79–97)
Platelets: 378 10*3/uL (ref 150–450)
RBC: 4.57 x10E6/uL (ref 3.77–5.28)
RDW: 13.5 % (ref 12.3–15.4)
WBC: 7.1 10*3/uL (ref 3.4–10.8)

## 2017-07-02 LAB — AMYLASE: Amylase: 36 U/L (ref 31–124)

## 2017-07-02 LAB — T4, FREE: FREE T4: 1.2 ng/dL (ref 0.82–1.77)

## 2017-07-02 LAB — LIPASE: LIPASE: 22 U/L (ref 14–72)

## 2017-07-04 ENCOUNTER — Telehealth: Payer: Self-pay | Admitting: Nurse Practitioner

## 2017-07-13 NOTE — Telephone Encounter (Signed)
Pt called regarding scheduling for ct

## 2017-07-15 DIAGNOSIS — R1084 Generalized abdominal pain: Secondary | ICD-10-CM | POA: Diagnosis not present

## 2017-08-03 ENCOUNTER — Telehealth: Payer: Self-pay

## 2017-08-03 NOTE — Telephone Encounter (Signed)
lmom to call us back

## 2017-08-03 NOTE — Telephone Encounter (Signed)
As per Miranda Briggs advised pt for ct scan is negative and just keep a food diary of everthing she eats and she crampiing and diarrhea and also make follow up heather

## 2017-09-13 ENCOUNTER — Encounter: Payer: Self-pay | Admitting: Adult Health

## 2017-09-16 ENCOUNTER — Encounter: Payer: Self-pay | Admitting: Nurse Practitioner

## 2017-09-16 ENCOUNTER — Ambulatory Visit (INDEPENDENT_AMBULATORY_CARE_PROVIDER_SITE_OTHER): Payer: BLUE CROSS/BLUE SHIELD | Admitting: Nurse Practitioner

## 2017-09-16 VITALS — BP 131/101 | HR 69 | Resp 16 | Ht 63.0 in | Wt 212.8 lb

## 2017-09-16 DIAGNOSIS — R300 Dysuria: Secondary | ICD-10-CM | POA: Diagnosis not present

## 2017-09-16 DIAGNOSIS — R030 Elevated blood-pressure reading, without diagnosis of hypertension: Secondary | ICD-10-CM

## 2017-09-16 DIAGNOSIS — N926 Irregular menstruation, unspecified: Secondary | ICD-10-CM | POA: Diagnosis not present

## 2017-09-16 DIAGNOSIS — Z0001 Encounter for general adult medical examination with abnormal findings: Secondary | ICD-10-CM | POA: Insufficient documentation

## 2017-09-16 DIAGNOSIS — Z23 Encounter for immunization: Secondary | ICD-10-CM | POA: Diagnosis not present

## 2017-09-16 DIAGNOSIS — E559 Vitamin D deficiency, unspecified: Secondary | ICD-10-CM

## 2017-09-16 DIAGNOSIS — E038 Other specified hypothyroidism: Secondary | ICD-10-CM | POA: Insufficient documentation

## 2017-09-16 DIAGNOSIS — N912 Amenorrhea, unspecified: Secondary | ICD-10-CM

## 2017-09-16 DIAGNOSIS — E039 Hypothyroidism, unspecified: Secondary | ICD-10-CM

## 2017-09-16 NOTE — Progress Notes (Signed)
[facility] [place], [place] [address]  Internal MEDICINE  Office Visit Note  Patient Name: Miranda Briggs  [id number]  [id number]  Date of Service: 09/22/2017   Pt is here for routine health maintenance examination  Chief Complaint  Patient presents with  . Annual Exam     Patient arrives for annual physical exam. Patient does not have any present concerns but mentions that she stopped taking her birthcontrol pills about a year ago. She wants to get pregnant but so far was unsuccessful. She has irregular periods, the last one was 08/19/17 for 2 days and patient describes it as "spotting". Otherwise, patient feels well.      Current Medication: Outpatient Encounter Medications as of 09/16/2017  Medication Sig  . acetaminophen (TYLENOL) 325 MG tablet Take 650 mg by mouth every 6 (six) hours as needed (for pain.).  Marland Kitchen Multiple Vitamin (MULTIVITAMIN WITH MINERALS) TABS tablet Take 1 tablet by mouth every evening. One-A-Day  . naproxen sodium (ALEVE) 220 MG tablet Take 440 mg by mouth 2 (two) times daily as needed (for pain).  Marland Kitchen omeprazole (PRILOSEC) 40 MG capsule Take 40 mg by mouth at bedtime.   . [DISCONTINUED] dicyclomine (BENTYL) 10 MG capsule Take 1 capsule (10 mg total) by mouth 4 (four) times daily -  before meals and at bedtime. (Patient not taking: Reported on 09/16/2017)  . [DISCONTINUED] LORYNA 3-0.02 MG tablet Take 1 tablet by mouth daily.  . [DISCONTINUED] oxyCODONE-acetaminophen (ROXICET) 5-325 MG tablet Take 1 tablet by mouth every 4 (four) hours as needed. (Patient not taking: Reported on 06/30/2017)   No facility-administered encounter medications on file as of 09/16/2017.     Surgical History: Past Surgical History:  Procedure Laterality Date  . CHOLECYSTECTOMY N/A 12/03/2015   Procedure: LAPAROSCOPIC CHOLECYSTECTOMY WITH INTRAOPERATIVE CHOLANGIOGRAM;  Surgeon: Christene Lye, MD;  Location: [facility] ORS;  Service: General;  Laterality:  N/A;  . TONSILLECTOMY      Medical History: Past Medical History:  Diagnosis Date  . GERD (gastroesophageal reflux disease)     Family History: Family History  Problem Relation Age of Onset  . Diabetes Neg Hx       Review of Systems  Constitutional: Negative for activity change, chills, fatigue and unexpected weight change.  HENT: Negative for congestion, postnasal drip, rhinorrhea, sneezing, sore throat and voice change.   Eyes: Negative.  Negative for redness.  Respiratory: Negative for cough, chest tightness, shortness of breath and wheezing.   Cardiovascular: Negative for chest pain and palpitations.  Gastrointestinal: Negative for abdominal pain, constipation, diarrhea, nausea and vomiting.  Endocrine: Negative for cold intolerance, heat intolerance, polydipsia, polyphagia and polyuria.  Genitourinary: Negative for dysuria and frequency.       Irregular menstrual periods. Has been off birth control for a year and is trying to get pregnant.   Musculoskeletal: Negative for arthralgias, back pain, joint swelling and neck pain.  Skin: Negative for rash.  Allergic/Immunologic: Negative for environmental allergies.  Neurological: Negative for dizziness, tremors, numbness and headaches.  Hematological: Negative for adenopathy. Does not bruise/bleed easily.  Psychiatric/Behavioral: Negative for behavioral problems (Depression), sleep disturbance and suicidal ideas. The patient is not nervous/anxious.      Today's Vitals   09/16/17 1430  BP: (!) 131/101  Pulse: 69  Resp: 16  SpO2: 97%  Weight: 212 lb 12.8 oz (96.5 kg)  Height: 5\' 3"  (1.6 m)    Physical Exam  Constitutional: She is oriented to person, place, and time. She appears  well-developed and well-nourished.  HENT:  Head: Normocephalic and atraumatic.  Nose: Nose normal.  Mouth/Throat: Oropharynx is clear and moist.  Eyes: Pupils are equal, round, and reactive to light. Conjunctivae and EOM are normal.  Neck:  Normal range of motion. Neck supple. No JVD present. No tracheal deviation present. No thyromegaly present.  Cardiovascular: Normal rate, regular rhythm, normal heart sounds and intact distal pulses.  Pulmonary/Chest: Effort normal and breath sounds normal. No respiratory distress. She has no wheezes. Right breast exhibits no inverted nipple, no mass, no nipple discharge, no skin change and no tenderness. Left breast exhibits no inverted nipple, no mass, no nipple discharge, no skin change and no tenderness.  Abdominal: Soft. Bowel sounds are normal. There is no tenderness.  Musculoskeletal: Normal range of motion.  Lymphadenopathy:    She has no cervical adenopathy.  Neurological: She is alert and oriented to person, place, and time.  Skin: Skin is warm and dry. Capillary refill takes less than 2 seconds.  Psychiatric: She has a normal mood and affect. Her behavior is normal. Judgment and thought content normal.  Nursing note and vitals reviewed.    LABS: Recent Results (from the past 2160 hour(s))  POCT Urinalysis Dipstick     Status: Abnormal   Collection Time: 06/30/17  5:14 PM  Result Value Ref Range   Color, UA     Clarity, UA     Glucose, UA Negative Negative   Bilirubin, UA negat    Ketones, UA negativ    Spec Grav, UA <=1.005 (A) 1.010 - 1.025   Blood, UA negative    pH, UA 7.0 5.0 - 8.0   Protein, UA Negative Negative   Urobilinogen, UA 0.2 0.2 or 1.0 E.U./dL   Nitrite, UA negative    Leukocytes, UA Negative Negative   Appearance     Odor    POCT urine pregnancy     Status: None   Collection Time: 06/30/17  5:14 PM  Result Value Ref Range   Preg Test, Ur Negative Negative  Comprehensive metabolic panel     Status: Abnormal   Collection Time: 07/01/17 12:00 PM  Result Value Ref Range   Glucose 85 65 - 99 mg/dL   BUN 9 6 - 20 mg/dL   Creatinine, Ser 0.81 0.57 - 1.00 mg/dL   GFR calc non Af Amer 99 >59 mL/min/1.73   GFR calc Af Amer 114 >59 mL/min/1.73    BUN/Creatinine Ratio 11 9 - 23   Sodium 141 134 - 144 mmol/L   Potassium 3.9 3.5 - 5.2 mmol/L   Chloride 101 96 - 106 mmol/L   CO2 25 20 - 29 mmol/L   Calcium 9.4 8.7 - 10.2 mg/dL   Total Protein 7.0 6.0 - 8.5 g/dL   Albumin 4.6 3.5 - 5.5 g/dL   Globulin, Total 2.4 1.5 - 4.5 g/dL   Albumin/Globulin Ratio 1.9 1.2 - 2.2   Bilirubin Total 0.7 0.0 - 1.2 mg/dL   Alkaline Phosphatase 75 39 - 117 IU/L   AST 24 0 - 40 IU/L   ALT 36 (H) 0 - 32 IU/L  CBC     Status: None   Collection Time: 07/01/17 12:00 PM  Result Value Ref Range   WBC 7.1 3.4 - 10.8 x10E3/uL   RBC 4.57 3.77 - 5.28 x10E6/uL   Hemoglobin 13.6 11.1 - 15.9 g/dL   Hematocrit 41.8 34.0 - 46.6 %   MCV 92 79 - 97 fL   MCH 29.8 26.6 - 33.0  pg   MCHC 32.5 31.5 - 35.7 g/dL   RDW 13.5 12.3 - 15.4 %   Platelets 378 150 - 450 x10E3/uL  T4, free     Status: None   Collection Time: 07/01/17 12:00 PM  Result Value Ref Range   Free T4 1.20 0.82 - 1.77 ng/dL  TSH     Status: Abnormal   Collection Time: 07/01/17 12:00 PM  Result Value Ref Range   TSH 4.700 (H) 0.450 - 4.500 uIU/mL  Amylase     Status: None   Collection Time: 07/01/17 12:00 PM  Result Value Ref Range   Amylase 36 31 - 124 U/L  Lipase     Status: None   Collection Time: 07/01/17 12:00 PM  Result Value Ref Range   Lipase 22 14 - 72 U/L  UA/M w/rflx Culture, Routine     Status: Abnormal   Collection Time: 09/16/17  2:33 PM  Result Value Ref Range   Specific Gravity, UA      <=1.005 (A) 1.005 - 1.030   pH, UA 6.5 5.0 - 7.5   Color, UA Yellow Yellow   Appearance Ur Clear Clear   Leukocytes, UA Negative Negative   Protein, UA Negative Negative/Trace   Glucose, UA Negative Negative   Ketones, UA Negative Negative   RBC, UA Negative Negative   Bilirubin, UA Negative Negative   Urobilinogen, Ur 0.2 0.2 - 1.0 mg/dL   Nitrite, UA Negative Negative   Microscopic Examination Comment     Comment: Microscopic follows if indicated.   Microscopic Examination See below:      Comment: Microscopic was indicated and was performed.   Urinalysis Reflex Comment     Comment: This specimen will not reflex to a Urine Culture.  Microscopic Examination     Status: None   Collection Time: 09/16/17  2:33 PM  Result Value Ref Range   WBC, UA None seen 0 - 5 /hpf   RBC, UA 0-2 0 - 2 /hpf   Epithelial Cells (non renal) None seen 0 - 10 /hpf   Casts None seen None seen /lpf   Mucus, UA Present Not Estab.   Bacteria, UA Few None seen/Few  CBC with Differential/Platelet     Status: None   Collection Time: 09/19/17 10:20 AM  Result Value Ref Range   WBC 5.9 3.4 - 10.8 x10E3/uL   RBC 4.43 3.77 - 5.28 x10E6/uL   Hemoglobin 14.1 11.1 - 15.9 g/dL   Hematocrit 40.1 34.0 - 46.6 %   MCV 91 79 - 97 fL   MCH 31.8 26.6 - 33.0 pg   MCHC 35.2 31.5 - 35.7 g/dL   RDW 12.3 12.3 - 15.4 %   Platelets 338 150 - 450 x10E3/uL   Neutrophils 53 Not Estab. %   Lymphs 34 Not Estab. %   Monocytes 9 Not Estab. %   Eos 3 Not Estab. %   Basos 1 Not Estab. %   Neutrophils Absolute 3.1 1.4 - 7.0 x10E3/uL   Lymphocytes Absolute 2.0 0.7 - 3.1 x10E3/uL   Monocytes Absolute 0.5 0.1 - 0.9 x10E3/uL   EOS (ABSOLUTE) 0.2 0.0 - 0.4 x10E3/uL   Basophils Absolute 0.0 0.0 - 0.2 x10E3/uL   Immature Granulocytes 0 Not Estab. %   Immature Grans (Abs) 0.0 0.0 - 0.1 x10E3/uL  Comprehensive metabolic panel     Status: Abnormal   Collection Time: 09/19/17 10:20 AM  Result Value Ref Range   Glucose 85 65 - 99 mg/dL  BUN 8 6 - 20 mg/dL   Creatinine, Ser 0.87 0.57 - 1.00 mg/dL   GFR calc non Af Amer 91 >59 mL/min/1.73   GFR calc Af Amer 105 >59 mL/min/1.73   BUN/Creatinine Ratio 9 9 - 23   Sodium 139 134 - 144 mmol/L   Potassium 4.0 3.5 - 5.2 mmol/L   Chloride 100 96 - 106 mmol/L   CO2 24 20 - 29 mmol/L   Calcium 9.7 8.7 - 10.2 mg/dL   Total Protein 7.0 6.0 - 8.5 g/dL   Albumin 4.5 3.5 - 5.5 g/dL   Globulin, Total 2.5 1.5 - 4.5 g/dL   Albumin/Globulin Ratio 1.8 1.2 - 2.2   Bilirubin Total 0.7 0.0 -  1.2 mg/dL   Alkaline Phosphatase 73 39 - 117 IU/L   AST 32 0 - 40 IU/L   ALT 40 (H) 0 - 32 IU/L  T4, free     Status: None   Collection Time: 09/19/17 10:20 AM  Result Value Ref Range   Free T4 1.36 0.82 - 1.77 ng/dL  TSH     Status: None   Collection Time: 09/19/17 10:20 AM  Result Value Ref Range   TSH 3.520 0.450 - 4.500 uIU/mL  Lipid panel     Status: None   Collection Time: 09/19/17 10:20 AM  Result Value Ref Range   Cholesterol, Total 151 100 - 199 mg/dL   Triglycerides 96 0 - 149 mg/dL   HDL 40 >39 mg/dL   VLDL Cholesterol Cal 19 5 - 40 mg/dL   LDL Calculated 92 0 - 99 mg/dL   Chol/HDL Ratio 3.8 0.0 - 4.4 ratio    Comment:                                   T. Chol/HDL Ratio                                             Men  Women                               1/2 Avg.Risk  3.4    3.3                                   Avg.Risk  5.0    4.4                                2X Avg.Risk  9.6    7.1                                3X Avg.Risk 23.4   11.0   Vitamin D 1,25 dihydroxy     Status: None (Preliminary result)   Collection Time: 09/19/17 10:20 AM  Result Value Ref Range   Vitamin D 1, 25 (OH)2 Total WILL FOLLOW    Vitamin D2 1, 25 (OH)2 WILL FOLLOW    Vitamin D3 1, 25 (OH)2 WILL FOLLOW   FSH/LH     Status: None   Collection Time: 09/19/17 10:20 AM  Result Value  Ref Range   LH 4.2 mIU/mL    Comment:                     Adult Female:                       Follicular phase      2.4 -  12.6                       Ovulation phase      14.0 -  95.6                       Luteal phase          1.0 -  11.4                       Postmenopausal        7.7 -  58.5    FSH 3.8 mIU/mL    Comment:                     Adult Female:                       Follicular phase      3.5 -  12.5                       Ovulation phase       4.7 -  21.5                       Luteal phase          1.7 -   7.7                       Postmenopausal       25.8 - 134.8   HgB A1c     Status: None    Collection Time: 09/19/17 10:20 AM  Result Value Ref Range   Hgb A1c MFr Bld 5.2 4.8 - 5.6 %    Comment:          Prediabetes: 5.7 - 6.4          Diabetes: >6.4          Glycemic control for adults with diabetes: <7.0    Est. average glucose Bld gHb Est-mCnc 103 mg/dL  Prolactin     Status: None   Collection Time: 09/19/17 10:20 AM  Result Value Ref Range   Prolactin 11.1 4.8 - 23.3 ng/mL  Estradiol     Status: None   Collection Time: 09/19/17 10:20 AM  Result Value Ref Range   Estradiol 101.6 pg/mL    Comment:                     Adult Female:                       Follicular phase   62.5 -   166.0                       Ovulation phase    85.8 -   498.0                       Luteal phase  43.8 -   211.0                       Postmenopausal     <6.0 -    54.7                     Pregnancy                       1st trimester     215.0 - >4300.0                     Girls (1-10 years)    6.0 -    27.0 Roche ECLIA methodology     Assessment/Plan: 1. Encounter for general adult medical examination with abnormal findings Annual labs, last TSH 4.700, needs to be rechecked - CBC with Differential/Platelet - Comprehensive metabolic panel - T4, free - TSH - Lipid panel  2. Irregular menstrual cycle  patient does not use any birthcontrol and has trouble to conceive. - T4, free - TSH - FSH/LH - Prolactin - Estradiol  3. Absence of menstruation Irregular menstruation interfering with child conception - HgB A1c - Prolactin - Estradiol  4. Vitamin D deficiency - Vitamin D 1,25 dihydroxy  5. Needs flu shot - Flu Vaccine MDCK QUAD PF  6. Subclinical hypothyroidism  Elevated TSH 4.700; T4 1.20 on 07/01/2017; Needs to be rechecked  7. Elevated blood pressure reading without diagnosis of hypertension Patient has elevated diastolic blood pressure during this visit. Blood pressure rechecked twice. Patient scheduled for follow up visit in 2 weeks to recheck the blood  pressure. Patient denies any headache and change in vision.  8. Dysuria - UA/M w/rflx Culture, Routine    General Counseling: Dequita verbalizes understanding of the findings of todays visit and agrees with plan of treatment. I have discussed any further diagnostic evaluation that may be needed or ordered today. We also reviewed her medications today. she has been encouraged to call the office with any questions or concerns that should arise related to todays visit.    Counseling:  This patient was seen by Leretha Pol FNP Collaboration with Dr Lavera Guise as a part of collaborative care agreement  Orders Placed This Encounter  Procedures  . Microscopic Examination  . Flu Vaccine MDCK QUAD PF  . UA/M w/rflx Culture, Routine  . CBC with Differential/Platelet  . Comprehensive metabolic panel  . T4, free  . TSH  . Lipid panel  . Vitamin D 1,25 dihydroxy  . FSH/LH  . HgB A1c  . Prolactin  . Estradiol      Time spent: Dawson, MD  Internal Medicine

## 2017-09-17 LAB — MICROSCOPIC EXAMINATION
Casts: NONE SEEN /lpf
EPITHELIAL CELLS (NON RENAL): NONE SEEN /HPF (ref 0–10)
WBC UA: NONE SEEN /HPF (ref 0–5)

## 2017-09-17 LAB — UA/M W/RFLX CULTURE, ROUTINE
BILIRUBIN UA: NEGATIVE
GLUCOSE, UA: NEGATIVE
KETONES UA: NEGATIVE
Leukocytes, UA: NEGATIVE
Nitrite, UA: NEGATIVE
PROTEIN UA: NEGATIVE
RBC, UA: NEGATIVE
UUROB: 0.2 mg/dL (ref 0.2–1.0)
pH, UA: 6.5 (ref 5.0–7.5)

## 2017-09-19 DIAGNOSIS — E559 Vitamin D deficiency, unspecified: Secondary | ICD-10-CM | POA: Diagnosis not present

## 2017-09-19 DIAGNOSIS — N912 Amenorrhea, unspecified: Secondary | ICD-10-CM | POA: Diagnosis not present

## 2017-09-19 DIAGNOSIS — N926 Irregular menstruation, unspecified: Secondary | ICD-10-CM | POA: Diagnosis not present

## 2017-09-19 DIAGNOSIS — Z0001 Encounter for general adult medical examination with abnormal findings: Secondary | ICD-10-CM | POA: Diagnosis not present

## 2017-09-23 LAB — COMPREHENSIVE METABOLIC PANEL
ALT: 40 IU/L — AB (ref 0–32)
AST: 32 IU/L (ref 0–40)
Albumin/Globulin Ratio: 1.8 (ref 1.2–2.2)
Albumin: 4.5 g/dL (ref 3.5–5.5)
Alkaline Phosphatase: 73 IU/L (ref 39–117)
BILIRUBIN TOTAL: 0.7 mg/dL (ref 0.0–1.2)
BUN/Creatinine Ratio: 9 (ref 9–23)
BUN: 8 mg/dL (ref 6–20)
CHLORIDE: 100 mmol/L (ref 96–106)
CO2: 24 mmol/L (ref 20–29)
Calcium: 9.7 mg/dL (ref 8.7–10.2)
Creatinine, Ser: 0.87 mg/dL (ref 0.57–1.00)
GFR calc Af Amer: 105 mL/min/{1.73_m2} (ref >59)
GFR, EST NON AFRICAN AMERICAN: 91 mL/min/{1.73_m2} (ref >59)
GLUCOSE: 85 mg/dL (ref 65–99)
Globulin, Total: 2.5 g/dL (ref 1.5–4.5)
Potassium: 4 mmol/L (ref 3.5–5.2)
SODIUM: 139 mmol/L (ref 134–144)
TOTAL PROTEIN: 7 g/dL (ref 6.0–8.5)

## 2017-09-23 LAB — CBC WITH DIFFERENTIAL/PLATELET
BASOS ABS: 0 10*3/uL (ref 0.0–0.2)
Basos: 1 %
EOS (ABSOLUTE): 0.2 10*3/uL (ref 0.0–0.4)
Eos: 3 %
Hematocrit: 40.1 % (ref 34.0–46.6)
Hemoglobin: 14.1 g/dL (ref 11.1–15.9)
IMMATURE GRANS (ABS): 0 10*3/uL (ref 0.0–0.1)
Immature Granulocytes: 0 %
LYMPHS: 34 %
Lymphocytes Absolute: 2 10*3/uL (ref 0.7–3.1)
MCH: 31.8 pg (ref 26.6–33.0)
MCHC: 35.2 g/dL (ref 31.5–35.7)
MCV: 91 fL (ref 79–97)
Monocytes Absolute: 0.5 10*3/uL (ref 0.1–0.9)
Monocytes: 9 %
NEUTROS ABS: 3.1 10*3/uL (ref 1.4–7.0)
NEUTROS PCT: 53 %
PLATELETS: 338 10*3/uL (ref 150–450)
RBC: 4.43 x10E6/uL (ref 3.77–5.28)
RDW: 12.3 % (ref 12.3–15.4)
WBC: 5.9 10*3/uL (ref 3.4–10.8)

## 2017-09-23 LAB — LIPID PANEL
CHOLESTEROL TOTAL: 151 mg/dL (ref 100–199)
Chol/HDL Ratio: 3.8 ratio (ref 0.0–4.4)
HDL: 40 mg/dL (ref >39)
LDL Calculated: 92 mg/dL (ref 0–99)
Triglycerides: 96 mg/dL (ref 0–149)
VLDL Cholesterol Cal: 19 mg/dL (ref 5–40)

## 2017-09-23 LAB — HEMOGLOBIN A1C
Est. average glucose Bld gHb Est-mCnc: 103 mg/dL
Hgb A1c MFr Bld: 5.2 % (ref 4.8–5.6)

## 2017-09-23 LAB — ESTRADIOL: Estradiol: 101.6 pg/mL

## 2017-09-23 LAB — VITAMIN D 1,25 DIHYDROXY
VITAMIN D 1, 25 (OH) TOTAL: 38 pg/mL
VITAMIN D3 1, 25 (OH): 38 pg/mL

## 2017-09-23 LAB — PROLACTIN: PROLACTIN: 11.1 ng/mL (ref 4.8–23.3)

## 2017-09-23 LAB — T4, FREE: FREE T4: 1.36 ng/dL (ref 0.82–1.77)

## 2017-09-23 LAB — FSH/LH
FSH: 3.8 m[IU]/mL
LH: 4.2 m[IU]/mL

## 2017-09-23 LAB — TSH: TSH: 3.52 u[IU]/mL (ref 0.450–4.500)

## 2017-09-27 ENCOUNTER — Encounter: Payer: Self-pay | Admitting: Nurse Practitioner

## 2017-09-27 NOTE — Progress Notes (Signed)
SCANNED IN CT RESULTS.

## 2017-09-28 NOTE — Progress Notes (Signed)
Hey. This is missing page 2 of the report. Do you know where this is?

## 2017-10-03 ENCOUNTER — Encounter: Payer: Self-pay | Admitting: Nurse Practitioner

## 2017-10-03 ENCOUNTER — Ambulatory Visit: Payer: BLUE CROSS/BLUE SHIELD | Admitting: Nurse Practitioner

## 2017-10-03 VITALS — BP 126/98 | HR 115 | Resp 16 | Ht 63.0 in | Wt 212.0 lb

## 2017-10-03 DIAGNOSIS — N979 Female infertility, unspecified: Secondary | ICD-10-CM

## 2017-10-03 DIAGNOSIS — R030 Elevated blood-pressure reading, without diagnosis of hypertension: Secondary | ICD-10-CM | POA: Diagnosis not present

## 2017-10-03 DIAGNOSIS — N926 Irregular menstruation, unspecified: Secondary | ICD-10-CM

## 2017-10-03 MED ORDER — ATENOLOL 25 MG PO TABS: 25.0000 mg | ORAL_TABLET | Freq: Every evening | ORAL | 3 refills | Status: DC

## 2017-10-03 NOTE — Progress Notes (Signed)
[facility] [place], [place] [address]  Internal MEDICINE  Office Visit Note  Patient Name: Miranda Briggs  [id number]  [id number]  Date of Service: 10/03/2017  Chief Complaint  Patient presents with  . Hypertension    elevated bp     The patient had been having trouble with irregular menstrual cycles. Some months, she had not been having menstrual cycles at all. She and her husband would like to have a second baby, but irregularity is making it difficult for her to get pregnant. She had labs done, check thyroid function and reproductive hormones for further evaluation. All were within normal limits and show that hormones are cycling normally.  Continues to have elevated blood pressure and elevated heart rate. She states this started at the end of her first pregnancy. Has not really normalized since the birth of her first son.       Current Medication: Outpatient Encounter Medications as of 10/03/2017  Medication Sig  . Multiple Vitamin (MULTIVITAMIN WITH MINERALS) TABS tablet Take 1 tablet by mouth every evening. One-A-Day  . omeprazole (PRILOSEC) 40 MG capsule Take 40 mg by mouth at bedtime.   Marland Kitchen acetaminophen (TYLENOL) 325 MG tablet Take 650 mg by mouth every 6 (six) hours as needed (for pain.).  Marland Kitchen atenolol (TENORMIN) 25 MG tablet Take 1 tablet (25 mg total) by mouth every evening.  . naproxen sodium (ALEVE) 220 MG tablet Take 440 mg by mouth 2 (two) times daily as needed (for pain).   No facility-administered encounter medications on file as of 10/03/2017.     Surgical History: Past Surgical History:  Procedure Laterality Date  . CHOLECYSTECTOMY N/A 12/03/2015   Procedure: LAPAROSCOPIC CHOLECYSTECTOMY WITH INTRAOPERATIVE CHOLANGIOGRAM;  Surgeon: Christene Lye, MD;  Location: [facility] ORS;  Service: General;  Laterality: N/A;  . TONSILLECTOMY      Medical History: Past Medical History:  Diagnosis Date  . GERD (gastroesophageal reflux  disease)     Family History: Family History  Problem Relation Age of Onset  . Diabetes Neg Hx     Social History   Socioeconomic History  . Marital status: Married    Spouse name: Not on file  . Number of children: Not on file  . Years of education: Not on file  . Highest education level: Not on file  Occupational History  . Not on file  Social Needs  . Financial resource strain: Not on file  . Food insecurity:    Worry: Not on file    Inability: Not on file  . Transportation needs:    Medical: Not on file    Non-medical: Not on file  Tobacco Use  . Smoking status: Never Smoker  . Smokeless tobacco: Never Used  Substance and Sexual Activity  . Alcohol use: Yes    Comment: OCC WINE  . Drug use: No  . Sexual activity: Not on file  Lifestyle  . Physical activity:    Days per week: Not on file    Minutes per session: Not on file  . Stress: Not on file  Relationships  . Social connections:    Talks on phone: Not on file    Gets together: Not on file    Attends religious service: Not on file    Active member of club or organization: Not on file    Attends meetings of clubs or organizations: Not on file    Relationship status: Not on file  . Intimate partner violence:  Fear of current or ex partner: Not on file    Emotionally abused: Not on file    Physically abused: Not on file    Forced sexual activity: Not on file  Other Topics Concern  . Not on file  Social History Narrative  . Not on file      Review of Systems  Constitutional: Negative for activity change, chills, fatigue and unexpected weight change.  HENT: Negative for congestion, postnasal drip, rhinorrhea, sneezing, sore throat and voice change.   Eyes: Negative.  Negative for redness.  Respiratory: Negative for cough, chest tightness, shortness of breath and wheezing.   Cardiovascular: Negative for chest pain and palpitations.       Blood pressure and heart rate both slightly elevated.    Gastrointestinal: Negative for abdominal pain, constipation, diarrhea, nausea and vomiting.  Endocrine: Negative for cold intolerance, heat intolerance, polydipsia, polyphagia and polyuria.  Genitourinary: Negative for dysuria and frequency.       Irregular menstrual periods. Has been off birth control for a year and is trying to get pregnant. Did have normal period which started 09/28/2017.   Musculoskeletal: Negative for arthralgias, back pain, joint swelling and neck pain.  Skin: Negative for rash.  Allergic/Immunologic: Negative for environmental allergies.  Neurological: Negative for dizziness, tremors, numbness and headaches.  Hematological: Negative for adenopathy. Does not bruise/bleed easily.  Psychiatric/Behavioral: Negative for behavioral problems (Depression), sleep disturbance and suicidal ideas. The patient is not nervous/anxious.     Vital Signs: BP (!) 126/98   Pulse (!) 115   Resp 16   Ht 5\' 3"  (1.6 m)   Wt 212 lb (96.2 kg)   LMP 09/28/2017 (LMP Unknown)   SpO2 98%   BMI 37.55 kg/m    Physical Exam  Constitutional: She is oriented to person, place, and time. She appears well-developed and well-nourished.  HENT:  Head: Normocephalic and atraumatic.  Nose: Nose normal.  Mouth/Throat: Oropharynx is clear and moist.  Eyes: Pupils are equal, round, and reactive to light. Conjunctivae and EOM are normal.  Neck: Normal range of motion. Neck supple. No JVD present. No tracheal deviation present. No thyromegaly present.  Cardiovascular: Normal rate, regular rhythm, normal heart sounds and intact distal pulses.  Pulmonary/Chest: Effort normal and breath sounds normal. No respiratory distress. She has no wheezes. Right breast exhibits no inverted nipple, no mass, no nipple discharge, no skin change and no tenderness. Left breast exhibits no inverted nipple, no mass, no nipple discharge, no skin change and no tenderness.  Abdominal: Soft. Bowel sounds are normal. There is no  tenderness.  Musculoskeletal: Normal range of motion.  Lymphadenopathy:    She has no cervical adenopathy.  Neurological: She is alert and oriented to person, place, and time.  Skin: Skin is warm and dry. Capillary refill takes less than 2 seconds.  Psychiatric: She has a normal mood and affect. Her behavior is normal. Judgment and thought content normal.  Nursing note and vitals reviewed.  Assessment/Plan:  1. Irregular menstrual cycle Reviewed blood work with patient with normal results. Refer to OB/GYN for further evaluation and treatment.  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 Refer to OB/GYN for further evaluation and treatment.  - Ambulatory referral to Gynecology  3. Elevated blood pressure reading without diagnosis of hypertension Start atenolol 25mg  every evening. Reviewed possible side effects. Encouraged her to monitor bp and heart rate at home. Reassess at next visit. - atenolol (TENORMIN)  25 MG tablet; Take 1 tablet (25 mg total) by mouth every evening.  Dispense: 30 tablet; Refill: 3  General Counseling: Shelva verbalizes understanding of the findings of todays visit and agrees with plan of treatment. I have discussed any further diagnostic evaluation that may be needed or ordered today. We also reviewed her medications today. she has been encouraged to call the office with any questions or concerns that should arise related to todays visit.  Hypertension Counseling:   The following hypertensive lifestyle modification were recommended and discussed:  1. Limiting alcohol intake to less than 1 oz/day of ethanol:(24 oz of beer or 8 oz of wine or 2 oz of 100-proof whiskey). 2. Take baby ASA 81 mg daily. 3. Importance of regular aerobic exercise and losing weight. 4. Reduce dietary saturated fat and cholesterol intake for overall cardiovascular health. 5. Maintaining adequate dietary potassium,  calcium, and magnesium intake. 6. Regular monitoring of the blood pressure. 7. Reduce sodium intake to less than 100 mmol/day (less than 2.3 gm of sodium or less than 6 gm of sodium choride)   This patient was seen by [place] with Dr Lavera Guise as a part of collaborative care agreement  Orders Placed This Encounter  Procedures  . Ambulatory referral to Gynecology    Meds ordered this encounter  Medications  . atenolol (TENORMIN) 25 MG tablet    Sig: Take 1 tablet (25 mg total) by mouth every evening.    Dispense:  30 tablet    Refill:  3    Order Specific Question:   Supervising Provider    Answer:   Lavera Guise [[id number]]    Time spent: 37 Minutes      Dr Lavera Guise Internal medicine

## 2017-10-28 DIAGNOSIS — N970 Female infertility associated with anovulation: Secondary | ICD-10-CM | POA: Diagnosis not present

## 2017-10-28 DIAGNOSIS — E669 Obesity, unspecified: Secondary | ICD-10-CM | POA: Diagnosis not present

## 2017-10-28 DIAGNOSIS — N979 Female infertility, unspecified: Secondary | ICD-10-CM | POA: Diagnosis not present

## 2017-11-08 DIAGNOSIS — E282 Polycystic ovarian syndrome: Secondary | ICD-10-CM | POA: Diagnosis not present

## 2017-11-08 DIAGNOSIS — N979 Female infertility, unspecified: Secondary | ICD-10-CM | POA: Diagnosis not present

## 2017-11-14 IMAGING — CR DG CHOLANGIOGRAM OPERATIVE
1 series · 10 of 10 positions shown · non-contrast
Comparison: Ultrasound 11/20/2015

CLINICAL DATA: Cholelithiasis.

EXAM:
INTRAOPERATIVE CHOLANGIOGRAM
TECHNIQUE: Cholangiographic images from the C-arm fluoroscopic device were
submitted for interpretation post-operatively. Please see the
procedural report for the amount of contrast and the fluoroscopy
time utilized.

[Series 4: cont. · 10 of 54 frames shown]
[frame 1/54]
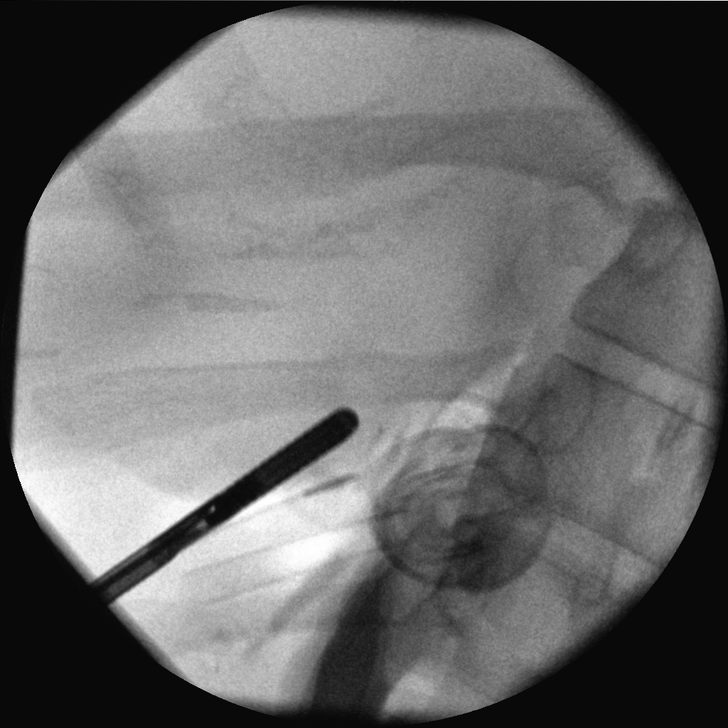
[frame 6/54]
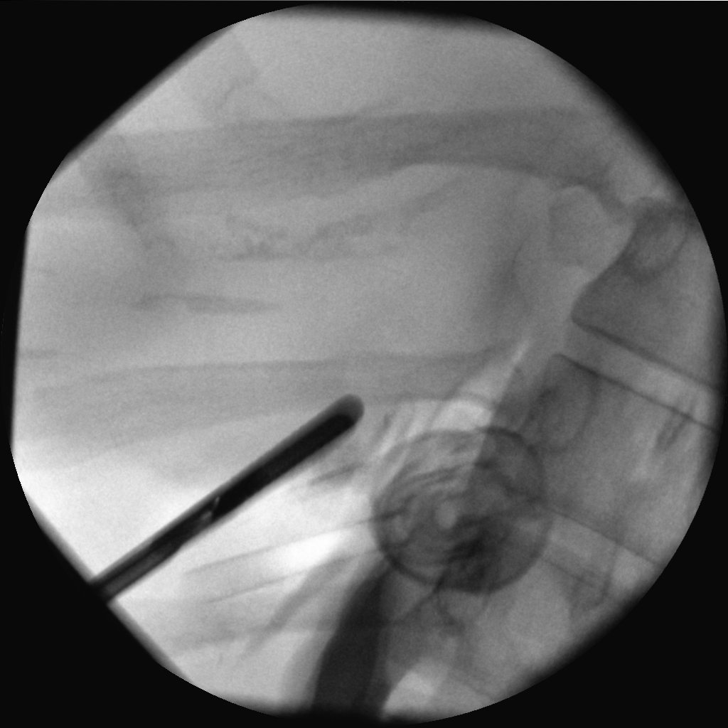
[frame 12/54]
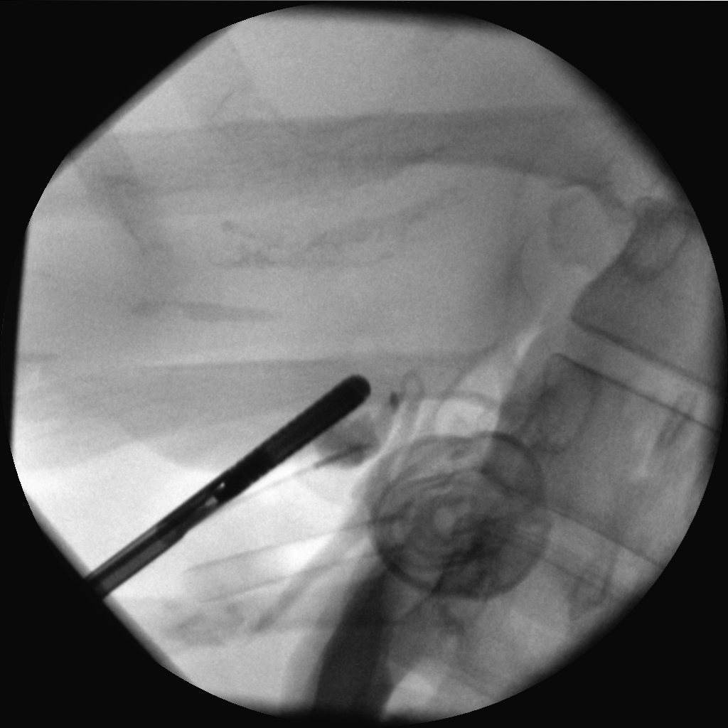
[frame 18/54]
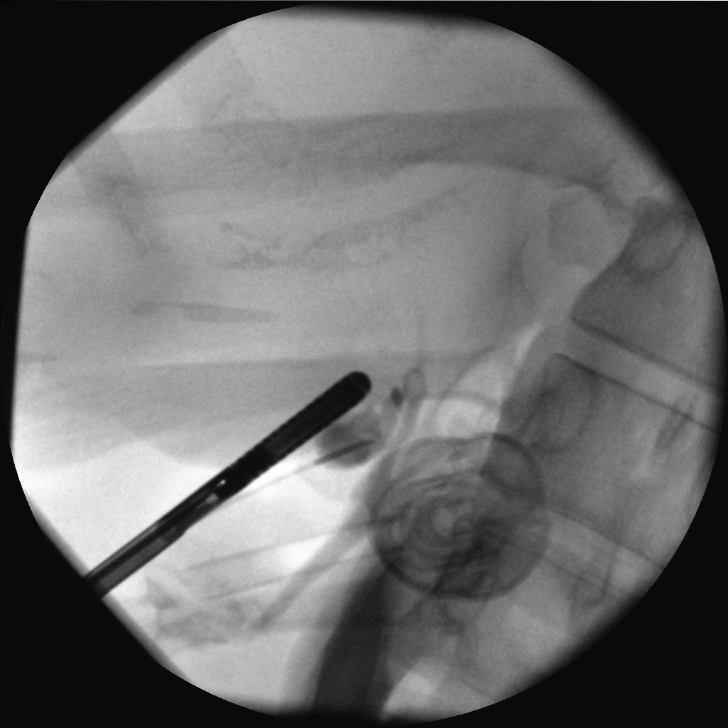
[frame 24/54]
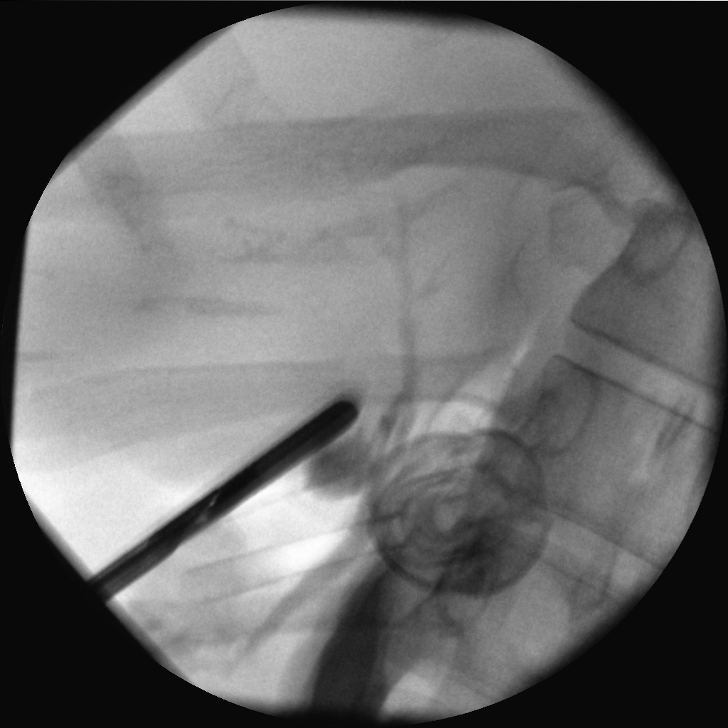
[frame 30/54]
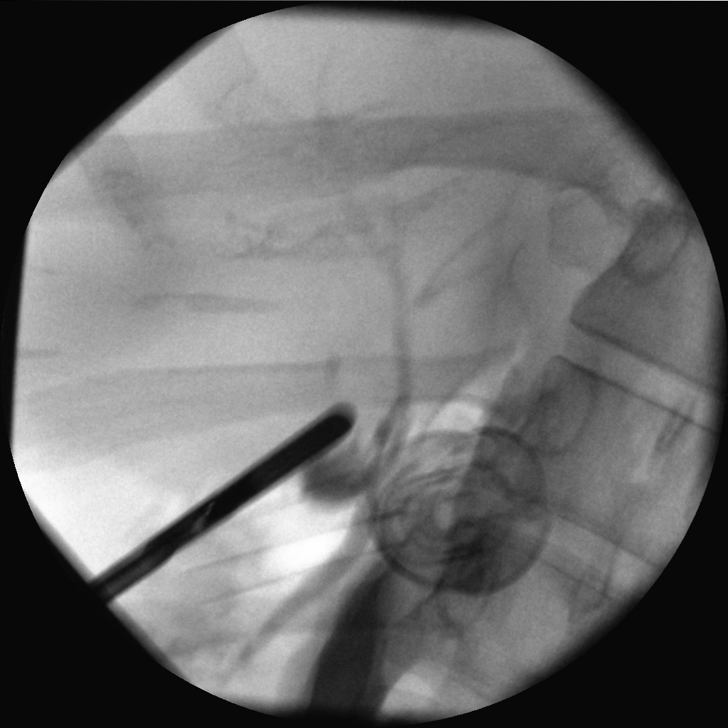
[frame 36/54]
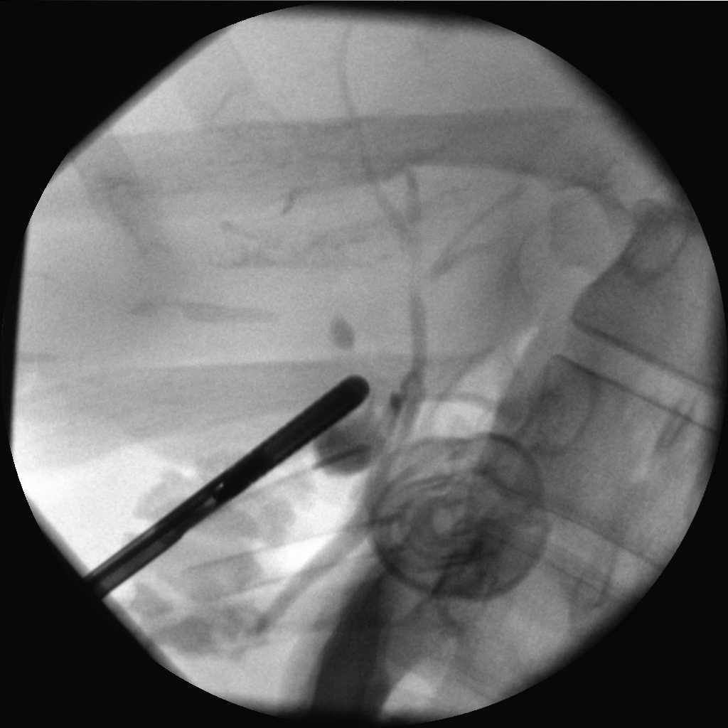
[frame 42/54]
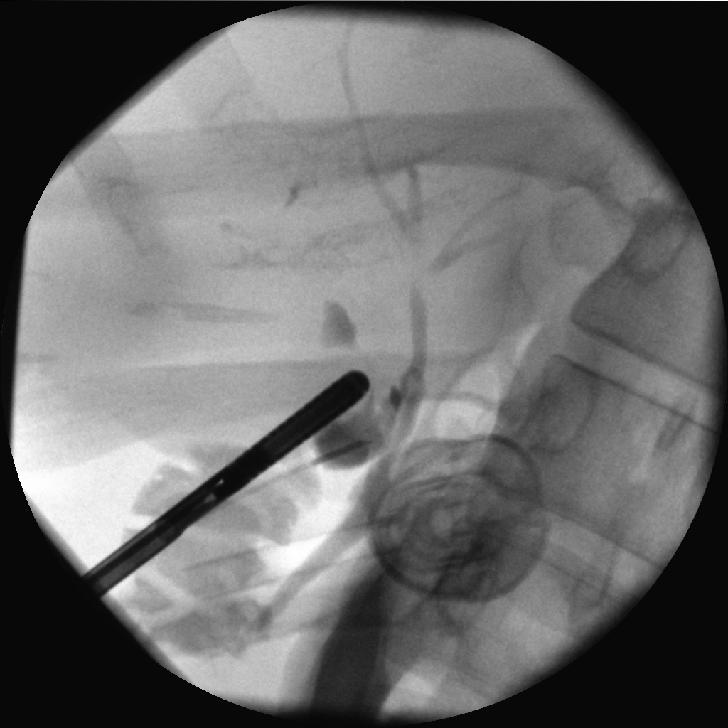
[frame 48/54]
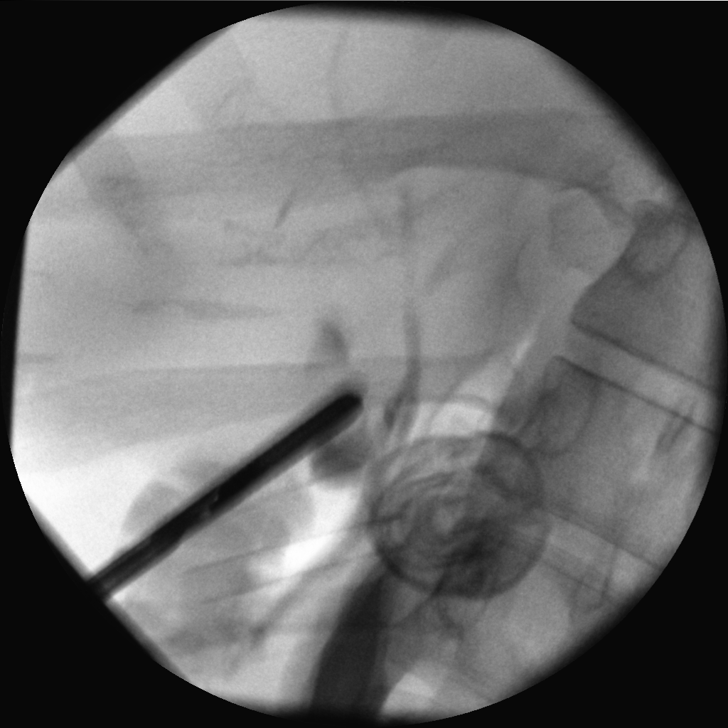
[frame 54/54]
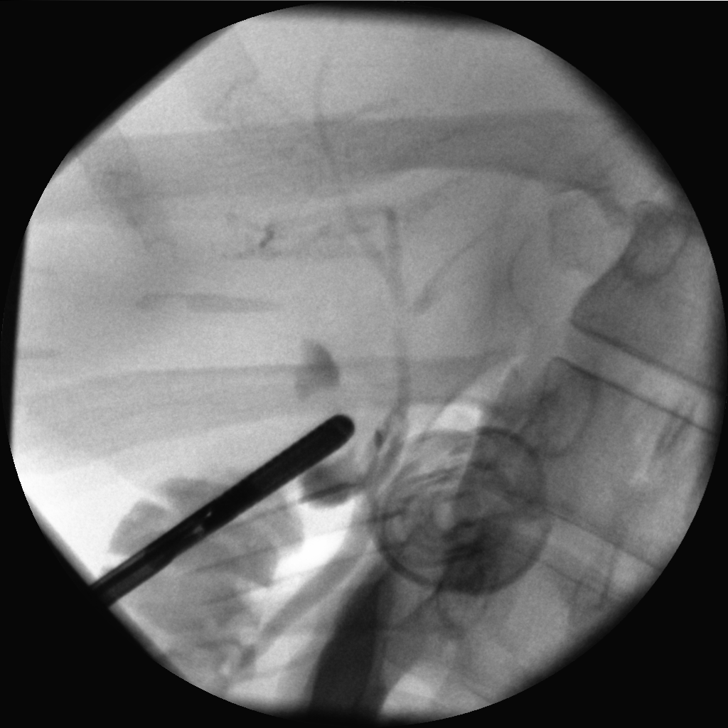

[10 of 10 positions shown; findings below may reference images not displayed]

FINDINGS: Contrast opacification of the biliary system without dilatation.
There is some reflux into the intrahepatic biliary system. Contrast
drains into the duodenum. No large filling defects or stones. There
appears to be some contrast refluxing into the gallbladder.
IMPRESSION: Patent biliary system.  No obstructing stones.

## 2017-11-22 ENCOUNTER — Encounter: Payer: Self-pay | Admitting: Nurse Practitioner

## 2017-11-22 ENCOUNTER — Ambulatory Visit: Payer: BLUE CROSS/BLUE SHIELD | Admitting: Nurse Practitioner

## 2017-11-22 VITALS — BP 122/81 | HR 61 | Resp 16 | Ht 63.0 in | Wt 209.0 lb

## 2017-11-22 DIAGNOSIS — R030 Elevated blood-pressure reading, without diagnosis of hypertension: Secondary | ICD-10-CM | POA: Diagnosis not present

## 2017-11-22 DIAGNOSIS — N979 Female infertility, unspecified: Secondary | ICD-10-CM | POA: Diagnosis not present

## 2017-11-22 NOTE — Progress Notes (Signed)
[facility] [place], [place] [address]  Internal MEDICINE  Office Visit Note  Patient Name: Miranda Briggs  [id number]  [id number]  Date of Service: 11/26/2017  Chief Complaint  Patient presents with  . Hypertension    The patient is here for follow up of tachycardia and elevated blood pressure. She was started on atenolol 25mg  at night. Her blood pressure and heart rate are much improved. She states that she does have some orthostatic dizziness and some fatigue since starting this medication, but has otherwise, had no negative side effects.  She has seen GYN provider in regard to problems conceiving second child. They are doing additional testing of her hormones and  For PCOS.       Current Medication: Outpatient Encounter Medications as of 11/22/2017  Medication Sig  . acetaminophen (TYLENOL) 325 MG tablet Take 650 mg by mouth every 6 (six) hours as needed (for pain.).  Marland Kitchen atenolol (TENORMIN) 25 MG tablet Take 1 tablet (25 mg total) by mouth every evening.  . Multiple Vitamin (MULTIVITAMIN WITH MINERALS) TABS tablet Take 1 tablet by mouth every evening. One-A-Day  . naproxen sodium (ALEVE) 220 MG tablet Take 440 mg by mouth 2 (two) times daily as needed (for pain).  Marland Kitchen omeprazole (PRILOSEC) 40 MG capsule Take 40 mg by mouth at bedtime.    No facility-administered encounter medications on file as of 11/22/2017.     Surgical History: Past Surgical History:  Procedure Laterality Date  . CHOLECYSTECTOMY N/A 12/03/2015   Procedure: LAPAROSCOPIC CHOLECYSTECTOMY WITH INTRAOPERATIVE CHOLANGIOGRAM;  Surgeon: Christene Lye, MD;  Location: [facility] ORS;  Service: General;  Laterality: N/A;  . TONSILLECTOMY      Medical History: Past Medical History:  Diagnosis Date  . GERD (gastroesophageal reflux disease)   . Hypertension     Family History: Family History  Problem Relation Age of Onset  . Diabetes Neg Hx     Social History   Socioeconomic  History  . Marital status: Married    Spouse name: Not on file  . Number of children: Not on file  . Years of education: Not on file  . Highest education level: Not on file  Occupational History  . Not on file  Social Needs  . Financial resource strain: Not on file  . Food insecurity:    Worry: Not on file    Inability: Not on file  . Transportation needs:    Medical: Not on file    Non-medical: Not on file  Tobacco Use  . Smoking status: Never Smoker  . Smokeless tobacco: Never Used  Substance and Sexual Activity  . Alcohol use: Yes    Comment: OCC WINE  . Drug use: No  . Sexual activity: Not on file  Lifestyle  . Physical activity:    Days per week: Not on file    Minutes per session: Not on file  . Stress: Not on file  Relationships  . Social connections:    Talks on phone: Not on file    Gets together: Not on file    Attends religious service: Not on file    Active member of club or organization: Not on file    Attends meetings of clubs or organizations: Not on file    Relationship status: Not on file  . Intimate partner violence:    Fear of current or ex partner: Not on file    Emotionally abused: Not on file    Physically abused: Not on  file    Forced sexual activity: Not on file  Other Topics Concern  . Not on file  Social History Narrative  . Not on file      Review of Systems  Constitutional: Negative for activity change, chills, fatigue and unexpected weight change.  HENT: Negative for congestion, postnasal drip, rhinorrhea, sneezing, sore throat and voice change.   Eyes: Negative.  Negative for redness.  Respiratory: Negative for cough, chest tightness, shortness of breath and wheezing.   Cardiovascular: Negative for chest pain and palpitations.       Blood pressure and heart rate have improved since her last visit and since starting atenolol.  Gastrointestinal: Negative for abdominal pain, constipation, diarrhea, nausea and vomiting.  Endocrine:  Negative for cold intolerance, heat intolerance, polydipsia, polyphagia and polyuria.  Musculoskeletal: Negative for arthralgias, back pain, joint swelling and neck pain.  Skin: Negative for rash.  Allergic/Immunologic: Negative for environmental allergies.  Neurological: Negative for dizziness, tremors, numbness and headaches.  Hematological: Negative for adenopathy. Does not bruise/bleed easily.  Psychiatric/Behavioral: Negative for behavioral problems (Depression), sleep disturbance and suicidal ideas. The patient is not nervous/anxious.     Vital Signs: BP 122/81   Pulse 61   Resp 16   Ht 5\' 3"  (1.6 m)   Wt 209 lb (94.8 kg)   SpO2 98%   BMI 37.02 kg/m    Physical Exam  Constitutional: She is oriented to person, place, and time. She appears well-developed and well-nourished. No distress.  HENT:  Head: Normocephalic and atraumatic.  Mouth/Throat: No oropharyngeal exudate.  Eyes: Pupils are equal, round, and reactive to light. EOM are normal.  Neck: Normal range of motion. Neck supple. No JVD present. No tracheal deviation present. No thyromegaly present.  Cardiovascular: Normal rate, regular rhythm and normal heart sounds. Exam reveals no gallop and no friction rub.  No murmur heard. Pulmonary/Chest: Effort normal and breath sounds normal. No respiratory distress. She has no wheezes. She has no rales. She exhibits no tenderness.  Musculoskeletal: Normal range of motion.  Lymphadenopathy:    She has no cervical adenopathy.  Neurological: She is alert and oriented to person, place, and time. No cranial nerve deficit.  Skin: Skin is warm and dry. She is not diaphoretic.  Psychiatric: She has a normal mood and affect. Her behavior is normal. Judgment and thought content normal.  Nursing note and vitals reviewed.  Assessment/Plan: 1. Elevated blood pressure reading without diagnosis of hypertension Improved since most recent visit. Recommended she start taking 1/2 tablet every  evening to see if fatigue and intermittent dizziness improved. Will reassess at her next visit.  2. Female fertility problem Patient to continue to follow up with GYN provider.  General Counseling: Zanyah verbalizes understanding of the findings of todays visit and agrees with plan of treatment. I have discussed any further diagnostic evaluation that may be needed or ordered today. We also reviewed her medications today. she has been encouraged to call the office with any questions or concerns that should arise related to todays visit.  This patient was seen by Leretha Pol FNP Collaboration with Dr Lavera Guise as a part of collaborative care agreement  Time spent: 28 Minutes      Dr Lavera Guise Internal medicine

## 2017-11-28 DIAGNOSIS — E039 Hypothyroidism, unspecified: Secondary | ICD-10-CM | POA: Diagnosis not present

## 2017-11-28 DIAGNOSIS — I10 Essential (primary) hypertension: Secondary | ICD-10-CM | POA: Diagnosis not present

## 2017-11-28 DIAGNOSIS — E669 Obesity, unspecified: Secondary | ICD-10-CM | POA: Diagnosis not present

## 2017-11-29 DIAGNOSIS — N979 Female infertility, unspecified: Secondary | ICD-10-CM | POA: Diagnosis not present

## 2017-12-12 DIAGNOSIS — N979 Female infertility, unspecified: Secondary | ICD-10-CM | POA: Diagnosis not present

## 2018-01-03 DIAGNOSIS — N912 Amenorrhea, unspecified: Secondary | ICD-10-CM | POA: Diagnosis not present

## 2018-01-04 NOTE — L&D Delivery Note (Signed)
Delivery Note At 10:46 PM a viable female was delivered via Vaginal, Spontaneous (Presentation:vtx ;  ).  APGAR: 9, 9; weight  .   Placenta status: ,intact  .  Cord:  with the following complications:none .   Pt moved quickly after AROM . Second stage 5pushes with delivery of head and shoulders . Vigorous femal placed on pts abdomen and delayed cord clamping . Central superior periurethral laceration repaired with 000 vicryl . Other perineal standard fashion . Good hemostasis. Good cosmetic result  Anesthesia:  CLe Episiotomy: None Lacerations:  Periurethral and second degree perineal  Suture Repair: 2.0 3.0 vicryl Est. Blood Loss (mL):  400 cc  Mom to postpartum.  Baby to Couplet care / Skin to Skin.  Gwen Her Aristotle Lieb 08/14/2018, 11:05 PM

## 2018-01-05 DIAGNOSIS — N912 Amenorrhea, unspecified: Secondary | ICD-10-CM | POA: Diagnosis not present

## 2018-01-05 DIAGNOSIS — E039 Hypothyroidism, unspecified: Secondary | ICD-10-CM | POA: Diagnosis not present

## 2018-01-10 DIAGNOSIS — E039 Hypothyroidism, unspecified: Secondary | ICD-10-CM | POA: Insufficient documentation

## 2018-01-10 DIAGNOSIS — O99280 Endocrine, nutritional and metabolic diseases complicating pregnancy, unspecified trimester: Secondary | ICD-10-CM | POA: Diagnosis not present

## 2018-01-10 DIAGNOSIS — O10919 Unspecified pre-existing hypertension complicating pregnancy, unspecified trimester: Secondary | ICD-10-CM | POA: Insufficient documentation

## 2018-01-10 DIAGNOSIS — Z3687 Encounter for antenatal screening for uncertain dates: Secondary | ICD-10-CM | POA: Diagnosis not present

## 2018-01-10 DIAGNOSIS — Z3490 Encounter for supervision of normal pregnancy, unspecified, unspecified trimester: Secondary | ICD-10-CM | POA: Diagnosis not present

## 2018-01-16 DIAGNOSIS — O10911 Unspecified pre-existing hypertension complicating pregnancy, first trimester: Secondary | ICD-10-CM | POA: Diagnosis not present

## 2018-02-03 DIAGNOSIS — O0991 Supervision of high risk pregnancy, unspecified, first trimester: Secondary | ICD-10-CM | POA: Diagnosis not present

## 2018-02-03 DIAGNOSIS — Z6837 Body mass index (BMI) 37.0-37.9, adult: Secondary | ICD-10-CM | POA: Diagnosis not present

## 2018-02-03 DIAGNOSIS — O10911 Unspecified pre-existing hypertension complicating pregnancy, first trimester: Secondary | ICD-10-CM | POA: Diagnosis not present

## 2018-02-03 DIAGNOSIS — O0992 Supervision of high risk pregnancy, unspecified, second trimester: Secondary | ICD-10-CM | POA: Insufficient documentation

## 2018-02-03 LAB — OB RESULTS CONSOLE RUBELLA ANTIBODY, IGM: Rubella: IMMUNE

## 2018-02-03 LAB — OB RESULTS CONSOLE VARICELLA ZOSTER ANTIBODY, IGG: Varicella: IMMUNE

## 2018-02-03 LAB — OB RESULTS CONSOLE HEPATITIS B SURFACE ANTIGEN: Hepatitis B Surface Ag: NEGATIVE

## 2018-02-06 DIAGNOSIS — O0991 Supervision of high risk pregnancy, unspecified, first trimester: Secondary | ICD-10-CM | POA: Diagnosis not present

## 2018-02-06 DIAGNOSIS — O10911 Unspecified pre-existing hypertension complicating pregnancy, first trimester: Secondary | ICD-10-CM | POA: Diagnosis not present

## 2018-02-16 DIAGNOSIS — Z3481 Encounter for supervision of other normal pregnancy, first trimester: Secondary | ICD-10-CM | POA: Diagnosis not present

## 2018-02-16 DIAGNOSIS — O0991 Supervision of high risk pregnancy, unspecified, first trimester: Secondary | ICD-10-CM | POA: Diagnosis not present

## 2018-02-16 DIAGNOSIS — O10919 Unspecified pre-existing hypertension complicating pregnancy, unspecified trimester: Secondary | ICD-10-CM | POA: Diagnosis not present

## 2018-03-14 DIAGNOSIS — O99280 Endocrine, nutritional and metabolic diseases complicating pregnancy, unspecified trimester: Secondary | ICD-10-CM | POA: Diagnosis not present

## 2018-03-14 DIAGNOSIS — E039 Hypothyroidism, unspecified: Secondary | ICD-10-CM | POA: Diagnosis not present

## 2018-03-22 DIAGNOSIS — E039 Hypothyroidism, unspecified: Secondary | ICD-10-CM | POA: Diagnosis not present

## 2018-03-22 DIAGNOSIS — O99282 Endocrine, nutritional and metabolic diseases complicating pregnancy, second trimester: Secondary | ICD-10-CM | POA: Diagnosis not present

## 2018-03-22 DIAGNOSIS — O10912 Unspecified pre-existing hypertension complicating pregnancy, second trimester: Secondary | ICD-10-CM | POA: Diagnosis not present

## 2018-04-02 IMAGING — US US ABDOMEN COMPLETE
1 series · 14 of 25 positions shown · non-contrast
Comparison: None.

CLINICAL DATA: Generalized abdominal tenderness.

EXAM:
ABDOMEN ULTRASOUND COMPLETE

[Series 1: us abdomen complete · 0.23mm/px · 14 of 128 slices shown]
[im 1/128]
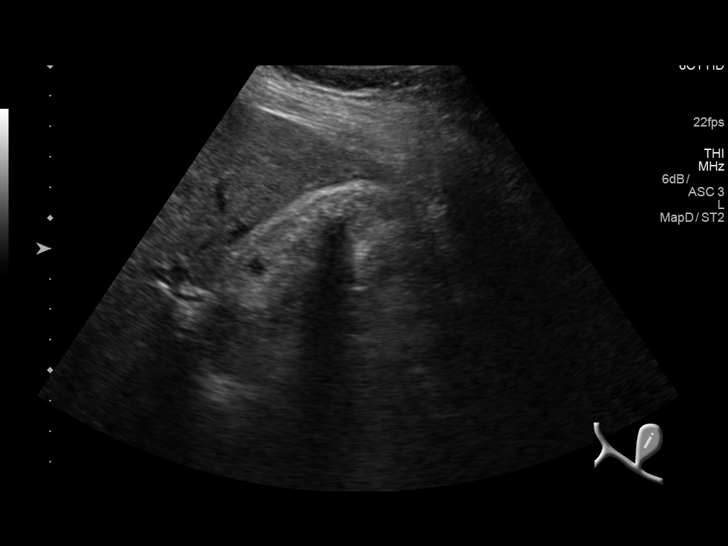
[im 11/128]
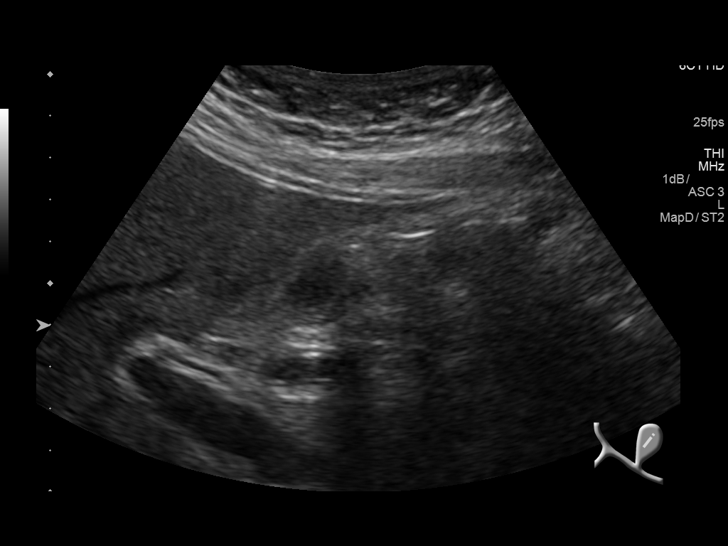
[im 22/128]
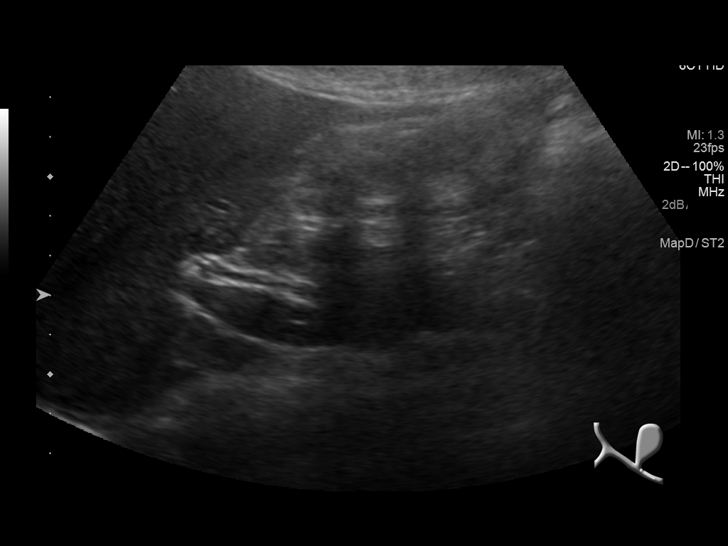
[im 32/128]
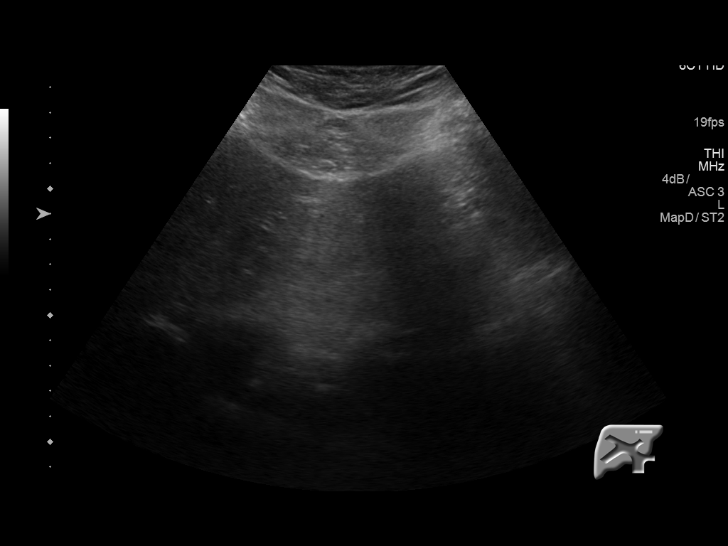
[im 43/128]
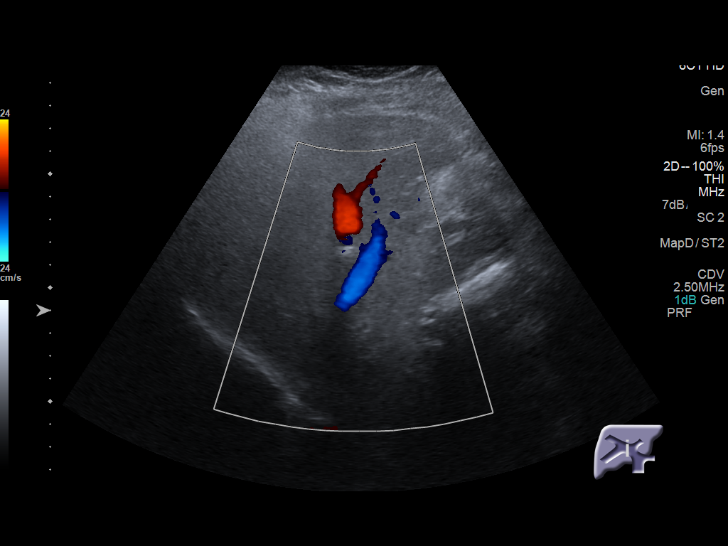
[im 48/128]
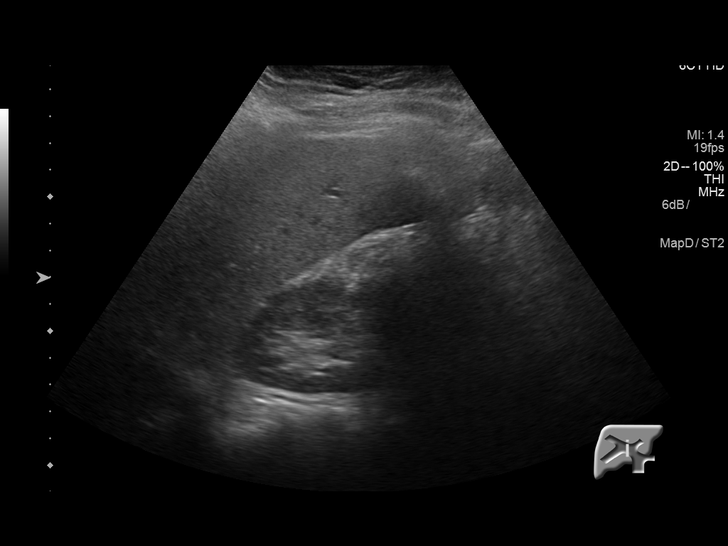
[im 59/128]
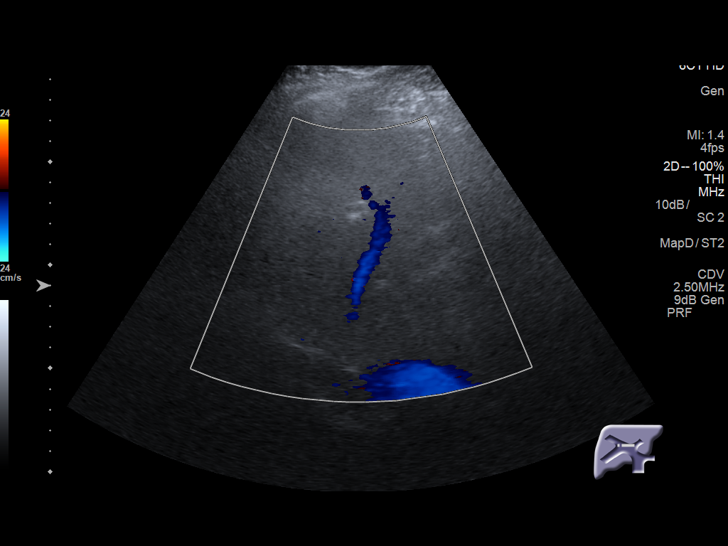
[im 69/128]
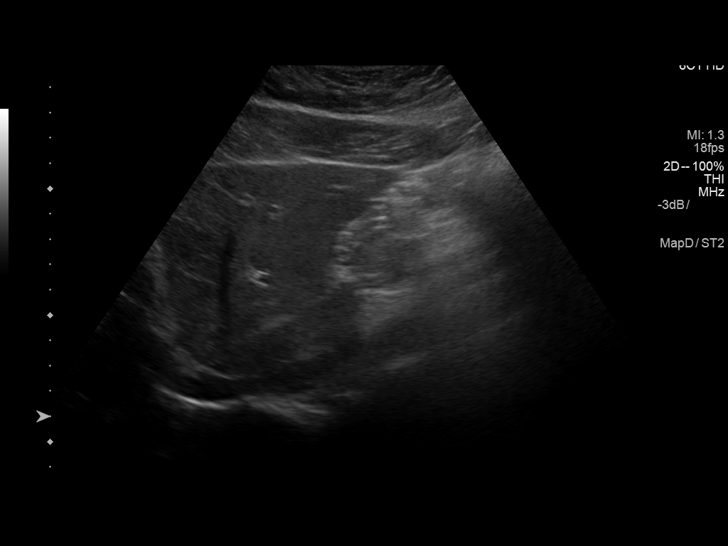
[im 80/128]
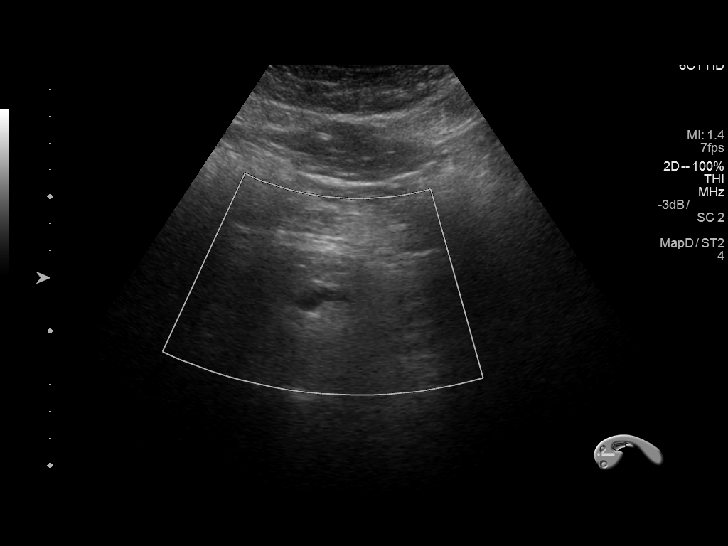
[im 85/128]
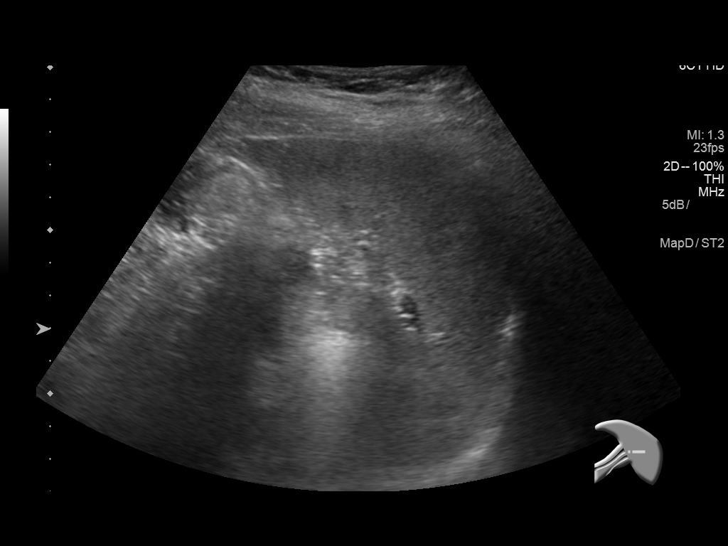
[im 96/128]
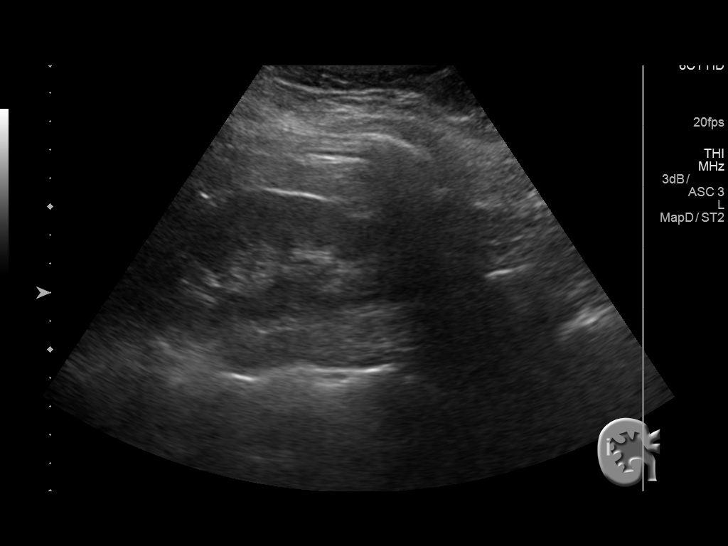
[im 106/128]
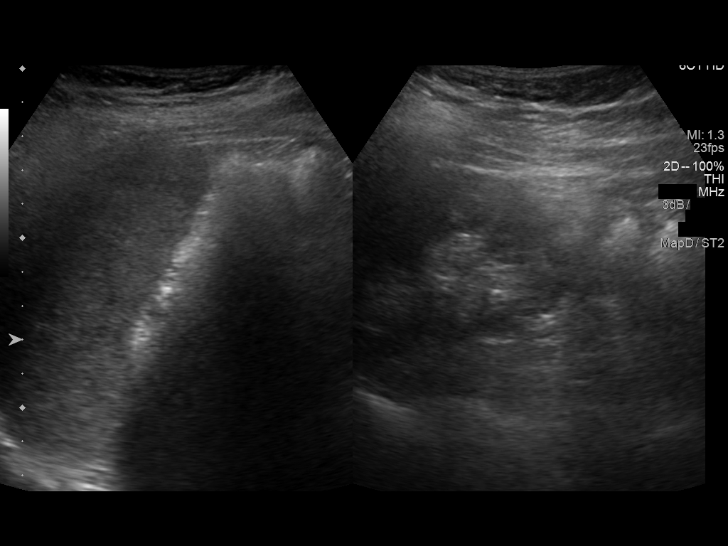
[im 117/128]
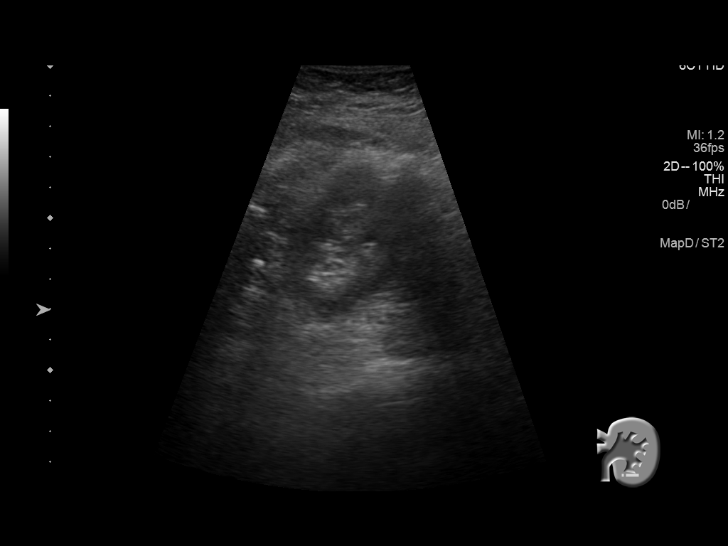
[im 128/128]
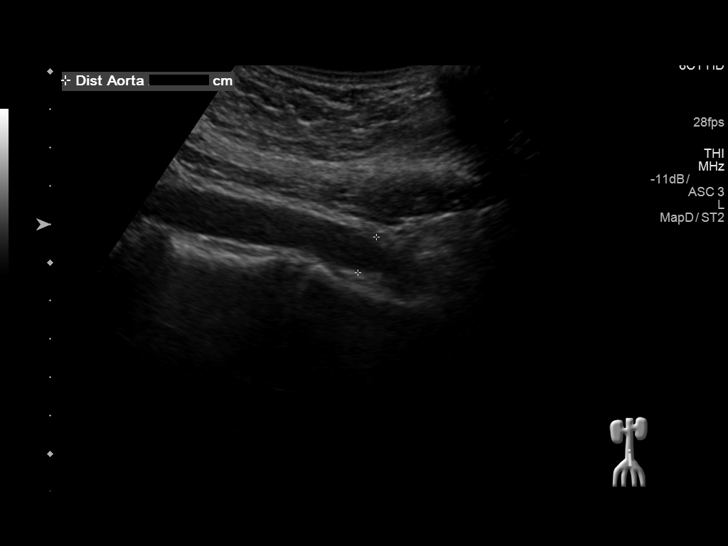

[14 of 25 positions shown; findings below may reference images not displayed]

FINDINGS: Gallbladder: Numerous gallstones obscuring most of the gallbladder
wall. Where seen anteriorly there is gallbladder thickening to 5 mm.
No focal tenderness or pericholecystic fluid.

Common bile duct: Diameter: 3 mm. Where visualized, no filling
defect.

Liver: No focal lesion identified. Within normal limits in
parenchymal echogenicity.

IVC: No abnormality visualized.

Pancreas: Limited visualization without abnormal finding.

Spleen: Size and appearance within normal limits.

Right Kidney: Length: 11 cm. Echogenicity within normal limits. No
mass or hydronephrosis visualized.

Left Kidney: Length: 12 cm. Echogenicity within normal limits. No
mass or hydronephrosis visualized.

Abdominal aorta: No aneurysm visualized.

Other findings: None.
IMPRESSION: 1. Multiple gallstones. The gallbladder wall is thickened but there
is no focal tenderness or pericholecystic edema typical of acute
cholecystitis.
2. Limited visualization of the pancreas.

## 2018-04-13 ENCOUNTER — Encounter: Payer: Self-pay | Admitting: Internal Medicine

## 2018-04-13 ENCOUNTER — Ambulatory Visit: Payer: BLUE CROSS/BLUE SHIELD | Admitting: Internal Medicine

## 2018-04-13 ENCOUNTER — Other Ambulatory Visit: Payer: Self-pay

## 2018-04-13 DIAGNOSIS — R490 Dysphonia: Secondary | ICD-10-CM

## 2018-04-13 DIAGNOSIS — J0100 Acute maxillary sinusitis, unspecified: Secondary | ICD-10-CM | POA: Diagnosis not present

## 2018-04-13 DIAGNOSIS — N979 Female infertility, unspecified: Secondary | ICD-10-CM | POA: Insufficient documentation

## 2018-04-13 MED ORDER — AZITHROMYCIN 250 MG PO TABS: ORAL_TABLET | ORAL | 0 refills | Status: DC

## 2018-04-13 MED ORDER — FLUTICASONE PROPIONATE 50 MCG/ACT NA SUSP: 2.0000 | Freq: Every day | NASAL | 6 refills | Status: DC

## 2018-04-13 NOTE — Progress Notes (Signed)
[facility] [place], [place] [address]  Internal MEDICINE  Telephone Visit  Patient Name: Miranda Briggs  [id number]  [id number]  Date of Service: 04/13/2018  I connected with the patient at 950by telephone and verified the patients identity using two identifiers.   I discussed the limitations, risks, security and privacy concerns of performing an evaluation and management service by telephone and the availability of in person appointments. I also discussed with the patient that there may be a patient responsible charge related to the service.  The patient expressed understanding and agrees to proceed.    Chief Complaint  Patient presents with  . Headache    Pressure behind the eyes, no chills or body aches, no fever, been going on for a couple of wks, pt have not been in contact with anyone sick that she know, pt is [redacted]wks pregnant  . Sinusitis    drainage more at night, horseness, green mucous pt tried claritin and mucinex OTC,   . Sore Throat   HPI  Pt has been sick for last 2 weeks, has been taking OTC meds, now greenish nasal mucus, she is [redacted] weeks pregnant as well. Denies any fever or chills, no sob  Current Medication: Outpatient Encounter Medications as of 04/13/2018  Medication Sig  . aspirin 81 MG tablet 1 tablet daily.  Marland Kitchen levothyroxine (SYNTHROID, LEVOTHROID) 50 MCG tablet Take 1 tablet by mouth.  . methyldopa (ALDOMET) 250 MG tablet Take 1 tablet by mouth 2 (two) times daily.  . Prenatal 28-0.8 MG TABS Take 1 tablet by mouth.  Marland Kitchen acetaminophen (TYLENOL) 325 MG tablet Take 650 mg by mouth every 6 (six) hours as needed (for pain.).  Marland Kitchen atenolol (TENORMIN) 25 MG tablet Take 1 tablet (25 mg total) by mouth every evening. (Patient not taking: Reported on 04/13/2018)  . azithromycin (ZITHROMAX) 250 MG tablet Use as directed per pack  . fluticasone (FLONASE) 50 MCG/ACT nasal spray Place 2 sprays into both nostrils daily.  . Multiple Vitamin (MULTIVITAMIN  WITH MINERALS) TABS tablet Take 1 tablet by mouth every evening. One-A-Day  . naproxen sodium (ALEVE) 220 MG tablet Take 440 mg by mouth 2 (two) times daily as needed (for pain).  Marland Kitchen omeprazole (PRILOSEC) 40 MG capsule Take 40 mg by mouth at bedtime.    No facility-administered encounter medications on file as of 04/13/2018.     Surgical History: Past Surgical History:  Procedure Laterality Date  . CHOLECYSTECTOMY N/A 12/03/2015   Procedure: LAPAROSCOPIC CHOLECYSTECTOMY WITH INTRAOPERATIVE CHOLANGIOGRAM;  Surgeon: Christene Lye, MD;  Location: [facility] ORS;  Service: General;  Laterality: N/A;  . TONSILLECTOMY      Medical History: Past Medical History:  Diagnosis Date  . GERD (gastroesophageal reflux disease)   . Hypertension     Family History: Family History  Problem Relation Age of Onset  . Diabetes Neg Hx     Social History   Socioeconomic History  . Marital status: Married    Spouse name: Not on file  . Number of children: Not on file  . Years of education: Not on file  . Highest education level: Not on file  Occupational History  . Not on file  Social Needs  . Financial resource strain: Not on file  . Food insecurity:    Worry: Not on file    Inability: Not on file  . Transportation needs:    Medical: Not on file    Non-medical: Not on file  Tobacco Use  .  Smoking status: Never Smoker  . Smokeless tobacco: Never Used  Substance and Sexual Activity  . Alcohol use: Not Currently    Comment: OCC WINE  . Drug use: No  . Sexual activity: Not on file  Lifestyle  . Physical activity:    Days per week: Not on file    Minutes per session: Not on file  . Stress: Not on file  Relationships  . Social connections:    Talks on phone: Not on file    Gets together: Not on file    Attends religious service: Not on file    Active member of club or organization: Not on file    Attends meetings of clubs or organizations: Not on file    Relationship status: Not on  file  . Intimate partner violence:    Fear of current or ex partner: Not on file    Emotionally abused: Not on file    Physically abused: Not on file    Forced sexual activity: Not on file  Other Topics Concern  . Not on file  Social History Narrative  . Not on file    Review of Systems  Constitutional: Negative.   HENT: Positive for congestion, postnasal drip and voice change. Negative for sore throat.   Eyes: Negative.   Respiratory: Positive for cough.   Allergic/Immunologic: Positive for environmental allergies.    Vital Signs: Temp 98.3 F (36.8 C)   Ht 5\' 3"  (1.6 m)   Wt 199 lb (90.3 kg)   LMP 09/28/2017 (LMP Unknown)   BMI 35.25 kg/m   Observation/Objective: Pt was visited through webcam, looked awake and alert. Nasal congestion and hoarseness noticed. upon cough no chest congestion was heard   Assessment/Plan: 1. Acute non-recurrent maxillary sinusitis - azithromycin (ZITHROMAX) 250 MG tablet; Use as directed per pack  Dispense: 6 tablet; Refill: 0 This not an ideal choice however due to pregnancy this is a safer choice  2. Hoarse Pt was instructed to drink more liquids, try honey with luke warm water . Rest  - fluticasone (FLONASE) 50 MCG/ACT nasal spray; Place 2 sprays into both nostrils daily.  Dispense: 16 g; Refill: 6  General Counseling: Miranda Briggs verbalizes understanding of the findings of today's phone visit and agrees with plan of treatment. I have discussed any further diagnostic evaluation that may be needed or ordered today. We also reviewed her medications today. she has been encouraged to call the office with any questions or concerns that should arise related to todays visit.   Meds ordered this encounter  Medications  . azithromycin (ZITHROMAX) 250 MG tablet    Sig: Use as directed per pack    Dispense:  6 tablet    Refill:  0  . fluticasone (FLONASE) 50 MCG/ACT nasal spray    Sig: Place 2 sprays into both nostrils daily.    Dispense:  16 g     Refill:  6    Time spent [facility]

## 2018-04-26 DIAGNOSIS — E039 Hypothyroidism, unspecified: Secondary | ICD-10-CM | POA: Diagnosis not present

## 2018-04-26 DIAGNOSIS — O99282 Endocrine, nutritional and metabolic diseases complicating pregnancy, second trimester: Secondary | ICD-10-CM | POA: Diagnosis not present

## 2018-05-22 ENCOUNTER — Ambulatory Visit: Payer: Self-pay | Admitting: Nurse Practitioner

## 2018-05-24 DIAGNOSIS — E039 Hypothyroidism, unspecified: Secondary | ICD-10-CM | POA: Diagnosis not present

## 2018-05-24 DIAGNOSIS — O10912 Unspecified pre-existing hypertension complicating pregnancy, second trimester: Secondary | ICD-10-CM | POA: Diagnosis not present

## 2018-05-24 DIAGNOSIS — O99282 Endocrine, nutritional and metabolic diseases complicating pregnancy, second trimester: Secondary | ICD-10-CM | POA: Diagnosis not present

## 2018-06-08 DIAGNOSIS — Z23 Encounter for immunization: Secondary | ICD-10-CM | POA: Diagnosis not present

## 2018-06-08 DIAGNOSIS — O0992 Supervision of high risk pregnancy, unspecified, second trimester: Secondary | ICD-10-CM | POA: Diagnosis not present

## 2018-06-08 DIAGNOSIS — O10913 Unspecified pre-existing hypertension complicating pregnancy, third trimester: Secondary | ICD-10-CM | POA: Diagnosis not present

## 2018-06-08 DIAGNOSIS — R3989 Other symptoms and signs involving the genitourinary system: Secondary | ICD-10-CM | POA: Diagnosis not present

## 2018-06-08 DIAGNOSIS — O99280 Endocrine, nutritional and metabolic diseases complicating pregnancy, unspecified trimester: Secondary | ICD-10-CM | POA: Diagnosis not present

## 2018-06-08 DIAGNOSIS — E039 Hypothyroidism, unspecified: Secondary | ICD-10-CM | POA: Diagnosis not present

## 2018-06-13 DIAGNOSIS — O0992 Supervision of high risk pregnancy, unspecified, second trimester: Secondary | ICD-10-CM | POA: Diagnosis not present

## 2018-06-16 DIAGNOSIS — R7309 Other abnormal glucose: Secondary | ICD-10-CM | POA: Diagnosis not present

## 2018-06-16 DIAGNOSIS — O0992 Supervision of high risk pregnancy, unspecified, second trimester: Secondary | ICD-10-CM | POA: Diagnosis not present

## 2018-06-23 ENCOUNTER — Encounter: Payer: BLUE CROSS/BLUE SHIELD | Attending: Certified Nurse Midwife | Admitting: *Deleted

## 2018-06-23 ENCOUNTER — Encounter: Payer: Self-pay | Admitting: *Deleted

## 2018-06-23 ENCOUNTER — Other Ambulatory Visit: Payer: Self-pay

## 2018-06-23 VITALS — BP 102/76 | Ht 63.0 in | Wt 205.4 lb

## 2018-06-23 DIAGNOSIS — O24419 Gestational diabetes mellitus in pregnancy, unspecified control: Secondary | ICD-10-CM | POA: Diagnosis not present

## 2018-06-23 DIAGNOSIS — Z3A00 Weeks of gestation of pregnancy not specified: Secondary | ICD-10-CM | POA: Diagnosis not present

## 2018-06-23 DIAGNOSIS — O24410 Gestational diabetes mellitus in pregnancy, diet controlled: Secondary | ICD-10-CM

## 2018-06-23 NOTE — Progress Notes (Signed)
Diabetes Self-Management Education  Visit Type: First/Initial  Appt. Start Time: 1345 Appt. End Time: 1520  06/23/2018  Ms. Miranda Briggs, identified by name and date of birth, is a 28 y.o. female with a diagnosis of Diabetes: Gestational Diabetes.   ASSESSMENT  Blood pressure 102/76, height 5\' 3"  (1.6 m), weight 205 lb 6.4 oz (93.2 kg), last menstrual period 11/08/2017, estimated date of delivery 08/27/2018 Body mass index is 36.38 kg/m.  Diabetes Self-Management Education - 06/23/18 1454      Visit Information   Visit Type  First/Initial      Initial Visit   Diabetes Type  Gestational Diabetes    Are you currently following a meal plan?  No    Are you taking your medications as prescribed?  Yes    Date Diagnosed  1 week      Health Coping   How would you rate your overall health?  Good      Psychosocial Assessment   Patient Belief/Attitude about Diabetes  Motivated to manage diabetes   "surprised"   Self-care barriers  None    Self-management support  Doctor's office;Family    Patient Concerns  Nutrition/Meal planning;Medication;Glycemic Control;Monitoring;Healthy Lifestyle    Special Needs  None    Preferred Learning Style  Visual;Hands on    Learning Readiness  Change in progress    How often do you need to have someone help you when you read instructions, pamphlets, or other written materials from your doctor or pharmacy?  1 - Never    What is the last grade level you completed in school?  college      Pre-Education Assessment   Patient understands the diabetes disease and treatment process.  Needs Instruction    Patient understands incorporating nutritional management into lifestyle.  Needs Instruction    Patient undertands incorporating physical activity into lifestyle.  Needs Instruction    Patient understands using medications safely.  Needs Instruction    Patient understands monitoring blood glucose, interpreting and using results  Needs Review    Patient  understands prevention, detection, and treatment of acute complications.  Needs Instruction    Patient understands prevention, detection, and treatment of chronic complications.  Needs Instruction    Patient understands how to develop strategies to address psychosocial issues.  Needs Instruction    Patient understands how to develop strategies to promote health/change behavior.  Needs Instruction      Complications   Last HgB A1C per patient/outside source  4.9 %   02/03/2018   How often do you check your blood sugar?  1-2 times/day she has a meter that a family member provided; gave her Contour Next meter that is preferred with her insurance plan   Fasting Blood glucose range (mg/dL)  70-129   FBG 84-114 mg/dL - 114 reading after eating supper of spaghetti   Postprandial Blood glucose range (mg/dL)  70-129   pp's 98 and 103 mg/dL   Have you had a dilated eye exam in the past 12 months?  No    Have you had a dental exam in the past 12 months?  No    Are you checking your feet?  No      Dietary Intake   Breakfast  Greek yogurt with granola    Lunch  peanut butter sandwich; grilled chicken salad    Snack (afternoon)  fruit    Dinner  chicken, beef, occasional pork and fish; potatoes, peas, beans, corn, rice, pasta, lettuce, tomatoes, carrots, cuccumbers, green beans,  broocli, cauliflower    Beverage(s)  water, caffeine free diet soda, bodyarmor      Exercise   Exercise Type  Light (walking / raking leaves)   prenatal cardio and yoga   How many days per week to you exercise?  4    How many minutes per day do you exercise?  30    Total minutes per week of exercise  120      Patient Education   Previous Diabetes Education  No    Disease state   Definition of diabetes, type 1 and 2, and the diagnosis of diabetes;Factors that contribute to the development of diabetes    Nutrition management   Role of diet in the treatment of diabetes and the relationship between the three main  macronutrients and blood glucose level;Reviewed blood glucose goals for pre and post meals and how to evaluate the patients' food intake on their blood glucose level.    Physical activity and exercise   Role of exercise on diabetes management, blood pressure control and cardiac health.    Monitoring  Taught/evaluated SMBG meter.;Purpose and frequency of SMBG.;Taught/discussed recording of test results and interpretation of SMBG.;Ketone testing, when, how.    Chronic complications  Relationship between chronic complications and blood glucose control    Psychosocial adjustment  Identified and addressed patients feelings and concerns about diabetes    Preconception care  Pregnancy and GDM  Role of pre-pregnancy blood glucose control on the development of the fetus;Reviewed with patient blood glucose goals with pregnancy;Role of family planning for patients with diabetes      Individualized Goals (developed by patient)   Reducing Risk  Improve blood sugars Decrease medications Prevent diabetes complications Lead a healthier lifestyle     Outcomes   Expected Outcomes  Demonstrated interest in learning. Expect positive outcomes       Individualized Plan for Diabetes Self-Management Training:   Learning Objective:  Patient will have a greater understanding of diabetes self-management. Patient education plan is to attend individual and/or group sessions per assessed needs and concerns.   Plan:   Patient Instructions  Read booklet on Gestational Diabetes Follow Gestational Meal Planning Guidelines Complete a 3 Day Food Record and bring to next appointment Check blood sugars 4 x day - before breakfast and 2 hrs after every meal and record  Bring blood sugar log to all appointments Call MD for prescription for meter strips and lancets Strips Contour Next   Lancets   Contour Microlet Purchase urine ketone strips if ordered by MD and check urine ketones every am:  If + increase bedtime snack to 1  protein and 2 carbohydrate servings Walk 20-30 minutes at least 5 x week if permitted by MD  Expected Outcomes:  Demonstrated interest in learning. Expect positive outcomes  Education material provided:  Gestational Booklet Gestational Meal Planning Guidelines Simple Meal Plan Viewed Gestational Diabetes Video Meter - Contour Next 3 Day Food Record Goals for a Healthy Pregnancy  If problems or questions, patient to contact team via:  Johny Drilling, [place], [place], CDE [phone]  Future DSME appointment:  June 29, 2018 with the dietitian

## 2018-06-23 NOTE — Patient Instructions (Signed)
Read booklet on Gestational Diabetes Follow Gestational Meal Planning Guidelines Complete a 3 Day Food Record and bring to next appointment Check blood sugars 4 x day - before breakfast and 2 hrs after every meal and record  Bring blood sugar log to all appointments Call MD for prescription for meter strips and lancets Strips Contour Next   Lancets   Contour Microlet Purchase urine ketone strips if ordered by MD and check urine ketones every am:  If + increase bedtime snack to 1 protein and 2 carbohydrate servings Walk 20-30 minutes at least 5 x week if permitted by MD

## 2018-06-29 ENCOUNTER — Encounter: Payer: BLUE CROSS/BLUE SHIELD | Admitting: Dietician

## 2018-06-29 ENCOUNTER — Other Ambulatory Visit: Payer: Self-pay

## 2018-06-29 ENCOUNTER — Encounter: Payer: Self-pay | Admitting: Dietician

## 2018-06-29 VITALS — BP 110/80 | Ht 63.0 in | Wt 204.5 lb

## 2018-06-29 DIAGNOSIS — O24410 Gestational diabetes mellitus in pregnancy, diet controlled: Secondary | ICD-10-CM

## 2018-06-29 DIAGNOSIS — O24419 Gestational diabetes mellitus in pregnancy, unspecified control: Secondary | ICD-10-CM | POA: Diagnosis not present

## 2018-06-29 DIAGNOSIS — Z3A00 Weeks of gestation of pregnancy not specified: Secondary | ICD-10-CM | POA: Diagnosis not present

## 2018-06-29 NOTE — Progress Notes (Signed)
   Patient's BG record indicates BGs all within goal; fasting BGs ranging 73-88mg /dl, and post-meal BGs ranging 74-118. (118 reading was after fast food biscuit and hash browns)  Patient's food diary indicates generally healthy food choices, some meals low in carb content. Discussed appropriate carb intake and healthy carb choices.  Provided 1800kcal meal plan, and wrote individualized menus based on patient's food preferences.  Instructed patient on food safety, including avoidance of Listeriosis, and limiting mercury from fish.  Discussed importance of maintaining healthy lifestyle habits to reduce risk of Type 2 DM as well as Gestational DM with any future pregnancies.  Advised patient to use any remaining testing supplies to test some BGs after delivery, and to have BG tested ideally annually, as well as prior to attempting future pregnancies.

## 2018-06-29 NOTE — Patient Instructions (Signed)
Continue with current eating pattern, can add healthy carb options up to 45grams with meals.

## 2018-07-12 DIAGNOSIS — O24419 Gestational diabetes mellitus in pregnancy, unspecified control: Secondary | ICD-10-CM | POA: Diagnosis not present

## 2018-07-12 DIAGNOSIS — E039 Hypothyroidism, unspecified: Secondary | ICD-10-CM | POA: Diagnosis not present

## 2018-07-12 DIAGNOSIS — O99282 Endocrine, nutritional and metabolic diseases complicating pregnancy, second trimester: Secondary | ICD-10-CM | POA: Diagnosis not present

## 2018-07-20 DIAGNOSIS — O24419 Gestational diabetes mellitus in pregnancy, unspecified control: Secondary | ICD-10-CM | POA: Diagnosis not present

## 2018-07-20 DIAGNOSIS — O09893 Supervision of other high risk pregnancies, third trimester: Secondary | ICD-10-CM | POA: Diagnosis not present

## 2018-07-20 DIAGNOSIS — O10013 Pre-existing essential hypertension complicating pregnancy, third trimester: Secondary | ICD-10-CM | POA: Diagnosis not present

## 2018-07-24 DIAGNOSIS — O99282 Endocrine, nutritional and metabolic diseases complicating pregnancy, second trimester: Secondary | ICD-10-CM | POA: Diagnosis not present

## 2018-07-24 DIAGNOSIS — O24419 Gestational diabetes mellitus in pregnancy, unspecified control: Secondary | ICD-10-CM | POA: Diagnosis not present

## 2018-07-24 DIAGNOSIS — E039 Hypothyroidism, unspecified: Secondary | ICD-10-CM | POA: Diagnosis not present

## 2018-07-24 DIAGNOSIS — O10013 Pre-existing essential hypertension complicating pregnancy, third trimester: Secondary | ICD-10-CM | POA: Diagnosis not present

## 2018-07-24 DIAGNOSIS — O09893 Supervision of other high risk pregnancies, third trimester: Secondary | ICD-10-CM | POA: Diagnosis not present

## 2018-08-01 DIAGNOSIS — O24419 Gestational diabetes mellitus in pregnancy, unspecified control: Secondary | ICD-10-CM | POA: Diagnosis not present

## 2018-08-01 DIAGNOSIS — O10919 Unspecified pre-existing hypertension complicating pregnancy, unspecified trimester: Secondary | ICD-10-CM | POA: Diagnosis not present

## 2018-08-01 DIAGNOSIS — O10013 Pre-existing essential hypertension complicating pregnancy, third trimester: Secondary | ICD-10-CM | POA: Diagnosis not present

## 2018-08-01 DIAGNOSIS — O10913 Unspecified pre-existing hypertension complicating pregnancy, third trimester: Secondary | ICD-10-CM | POA: Diagnosis not present

## 2018-08-01 DIAGNOSIS — O09893 Supervision of other high risk pregnancies, third trimester: Secondary | ICD-10-CM | POA: Diagnosis not present

## 2018-08-01 LAB — OB RESULTS CONSOLE RPR: RPR: NONREACTIVE

## 2018-08-01 LAB — OB RESULTS CONSOLE GBS: GBS: NEGATIVE

## 2018-08-01 LAB — OB RESULTS CONSOLE GC/CHLAMYDIA
Chlamydia: NEGATIVE
Gonorrhea: NEGATIVE

## 2018-08-01 LAB — OB RESULTS CONSOLE HIV ANTIBODY (ROUTINE TESTING): HIV: NONREACTIVE

## 2018-08-02 DIAGNOSIS — E039 Hypothyroidism, unspecified: Secondary | ICD-10-CM | POA: Diagnosis not present

## 2018-08-02 DIAGNOSIS — O99283 Endocrine, nutritional and metabolic diseases complicating pregnancy, third trimester: Secondary | ICD-10-CM | POA: Diagnosis not present

## 2018-08-08 DIAGNOSIS — O0993 Supervision of high risk pregnancy, unspecified, third trimester: Secondary | ICD-10-CM | POA: Diagnosis not present

## 2018-08-08 DIAGNOSIS — O10913 Unspecified pre-existing hypertension complicating pregnancy, third trimester: Secondary | ICD-10-CM | POA: Diagnosis not present

## 2018-08-08 DIAGNOSIS — O24419 Gestational diabetes mellitus in pregnancy, unspecified control: Secondary | ICD-10-CM | POA: Diagnosis not present

## 2018-08-08 DIAGNOSIS — O24410 Gestational diabetes mellitus in pregnancy, diet controlled: Secondary | ICD-10-CM | POA: Diagnosis not present

## 2018-08-09 ENCOUNTER — Other Ambulatory Visit: Payer: Self-pay | Admitting: Certified Nurse Midwife

## 2018-08-09 ENCOUNTER — Encounter: Payer: Self-pay | Admitting: Certified Nurse Midwife

## 2018-08-09 NOTE — Progress Notes (Signed)
  Miranda Briggs is a 28 y.o. G3P1011 female dated by [redacted]w[redacted]d ultrasound on 01/10/2018.    Pregnancy Issues: 1. Prepregnancy BMI 37 2. Hypothyroidism, currently on 5mcg Synthroid daily 3. Chronic hypertension on methyldopa 250mg  TID 4. History hrHPV+ 5. Gestational diabetes, diet controlled 6. Anemia on iron supplement  Prenatal care site: [facility]   Prenatal Labs: Blood type/Rh A+  Antibody screen neg  Rubella Immune  Varicella Immune  RPR NR  HBsAg Neg  HIV NR  GC neg  Chlamydia neg  Genetic screening declined  1 hour GTT 214  3 hour GTT 66, 213, 170, 133  GBS negative   Post Partum Planning: - Infant feeding: breastfeeding - Contraception: TBD

## 2018-08-14 ENCOUNTER — Inpatient Hospital Stay: Payer: BLUE CROSS/BLUE SHIELD | Admitting: Anesthesiology

## 2018-08-14 ENCOUNTER — Other Ambulatory Visit: Payer: Self-pay

## 2018-08-14 ENCOUNTER — Inpatient Hospital Stay
Admission: RE | Admit: 2018-08-14 | Discharge: 2018-08-16 | DRG: 806 | Disposition: A | Discharge status: Home / Self Care | Payer: BLUE CROSS/BLUE SHIELD | Attending: Obstetrics and Gynecology | Admitting: Obstetrics and Gynecology

## 2018-08-14 DIAGNOSIS — O99214 Obesity complicating childbirth: Secondary | ICD-10-CM | POA: Diagnosis present

## 2018-08-14 DIAGNOSIS — O134 Gestational [pregnancy-induced] hypertension without significant proteinuria, complicating childbirth: Secondary | ICD-10-CM | POA: Diagnosis not present

## 2018-08-14 DIAGNOSIS — Z20828 Contact with and (suspected) exposure to other viral communicable diseases: Secondary | ICD-10-CM | POA: Diagnosis present

## 2018-08-14 DIAGNOSIS — D62 Acute posthemorrhagic anemia: Secondary | ICD-10-CM | POA: Diagnosis not present

## 2018-08-14 DIAGNOSIS — O9081 Anemia of the puerperium: Secondary | ICD-10-CM | POA: Diagnosis not present

## 2018-08-14 DIAGNOSIS — E669 Obesity, unspecified: Secondary | ICD-10-CM | POA: Diagnosis present

## 2018-08-14 DIAGNOSIS — Z3A38 38 weeks gestation of pregnancy: Secondary | ICD-10-CM

## 2018-08-14 DIAGNOSIS — O24410 Gestational diabetes mellitus in pregnancy, diet controlled: Secondary | ICD-10-CM | POA: Diagnosis not present

## 2018-08-14 DIAGNOSIS — E039 Hypothyroidism, unspecified: Secondary | ICD-10-CM | POA: Diagnosis not present

## 2018-08-14 DIAGNOSIS — Z23 Encounter for immunization: Secondary | ICD-10-CM | POA: Diagnosis not present

## 2018-08-14 DIAGNOSIS — O99284 Endocrine, nutritional and metabolic diseases complicating childbirth: Secondary | ICD-10-CM | POA: Diagnosis not present

## 2018-08-14 DIAGNOSIS — O24420 Gestational diabetes mellitus in childbirth, diet controlled: Secondary | ICD-10-CM | POA: Diagnosis not present

## 2018-08-14 DIAGNOSIS — O10913 Unspecified pre-existing hypertension complicating pregnancy, third trimester: Secondary | ICD-10-CM | POA: Diagnosis not present

## 2018-08-14 DIAGNOSIS — O1002 Pre-existing essential hypertension complicating childbirth: Principal | ICD-10-CM | POA: Diagnosis present

## 2018-08-14 DIAGNOSIS — O1092 Unspecified pre-existing hypertension complicating childbirth: Secondary | ICD-10-CM | POA: Diagnosis not present

## 2018-08-14 DIAGNOSIS — O0993 Supervision of high risk pregnancy, unspecified, third trimester: Secondary | ICD-10-CM | POA: Diagnosis not present

## 2018-08-14 LAB — PROTEIN / CREATININE RATIO, URINE
Creatinine, Urine: 74 mg/dL
Protein Creatinine Ratio: 0.12 mg/mg{Cre} (ref 0.00–0.15)
Total Protein, Urine: 9 mg/dL

## 2018-08-14 LAB — CBC
HCT: 34.2 % — ABNORMAL LOW (ref 36.0–46.0)
Hemoglobin: 12 g/dL (ref 12.0–15.0)
MCH: 31.1 pg (ref 26.0–34.0)
MCHC: 35.1 g/dL (ref 30.0–36.0)
MCV: 88.6 fL (ref 80.0–100.0)
Platelets: 281 10*3/uL (ref 150–400)
RBC: 3.86 MIL/uL — ABNORMAL LOW (ref 3.87–5.11)
RDW: 12.6 % (ref 11.5–15.5)
WBC: 10.2 10*3/uL (ref 4.0–10.5)
nRBC: 0 % (ref 0.0–0.2)

## 2018-08-14 LAB — SARS CORONAVIRUS 2 BY RT PCR (HOSPITAL ORDER, PERFORMED IN ~~LOC~~ HOSPITAL LAB): SARS Coronavirus 2: NEGATIVE

## 2018-08-14 LAB — TYPE AND SCREEN
ABO/RH(D): A POS
Antibody Screen: NEGATIVE

## 2018-08-14 MED ORDER — OXYTOCIN 40 UNITS IN NORMAL SALINE INFUSION - SIMPLE MED: 1.0000 m[IU]/min | INTRAVENOUS | Status: DC

## 2018-08-14 MED ORDER — HYDRALAZINE HCL 20 MG/ML IJ SOLN: 10.0000 mg | INTRAMUSCULAR | Status: DC | PRN

## 2018-08-14 MED ORDER — FENTANYL 2.5 MCG/ML W/ROPIVACAINE 0.15% IN NS 100 ML EPIDURAL (ARMC): 12.0000 mL/h | EPIDURAL | Status: DC

## 2018-08-14 MED ORDER — EPHEDRINE 5 MG/ML INJ
10.0000 mg | INTRAVENOUS | Status: DC | PRN
  Filled 2018-08-14: qty 2

## 2018-08-14 MED ORDER — FENTANYL 2.5 MCG/ML W/ROPIVACAINE 0.15% IN NS 100 ML EPIDURAL (ARMC)
EPIDURAL | Status: DC | PRN
  Administered 2018-08-14: 12 mL/h via EPIDURAL

## 2018-08-14 MED ORDER — LIDOCAINE HCL (PF) 1 % IJ SOLN: 30.0000 mL | INTRAMUSCULAR | Status: DC | PRN

## 2018-08-14 MED ORDER — TERBUTALINE SULFATE 1 MG/ML IJ SOLN: 0.2500 mg | Freq: Once | INTRAMUSCULAR | Status: DC | PRN

## 2018-08-14 MED ORDER — DIPHENHYDRAMINE HCL 50 MG/ML IJ SOLN: 12.5000 mg | INTRAMUSCULAR | Status: DC | PRN

## 2018-08-14 MED ORDER — LACTATED RINGERS IV SOLN: 500.0000 mL | Freq: Once | INTRAVENOUS | Status: DC

## 2018-08-14 MED ORDER — ACETAMINOPHEN 325 MG PO TABS: 650.0000 mg | ORAL_TABLET | ORAL | Status: DC | PRN

## 2018-08-14 MED ORDER — FENTANYL 2.5 MCG/ML W/ROPIVACAINE 0.15% IN NS 100 ML EPIDURAL (ARMC)
EPIDURAL | Status: AC
  Filled 2018-08-14: qty 100

## 2018-08-14 MED ORDER — LABETALOL HCL 5 MG/ML IV SOLN
20.0000 mg | INTRAVENOUS | Status: DC | PRN
  Filled 2018-08-14: qty 4

## 2018-08-14 MED ORDER — LABETALOL HCL 5 MG/ML IV SOLN
80.0000 mg | INTRAVENOUS | Status: DC | PRN
  Filled 2018-08-14: qty 16

## 2018-08-14 MED ORDER — AMMONIA AROMATIC IN INHA
RESPIRATORY_TRACT | Status: AC
  Filled 2018-08-14: qty 10

## 2018-08-14 MED ORDER — ONDANSETRON HCL 4 MG/2ML IJ SOLN: 4.0000 mg | Freq: Four times a day (QID) | INTRAMUSCULAR | Status: DC | PRN

## 2018-08-14 MED ORDER — SODIUM CHLORIDE 0.9 % IV SOLN
INTRAVENOUS | Status: DC | PRN
  Administered 2018-08-14 (×3): 5 mL via EPIDURAL

## 2018-08-14 MED ORDER — LIDOCAINE HCL (PF) 1 % IJ SOLN
INTRAMUSCULAR | Status: AC
  Administered 2018-08-14: 30 mL
  Filled 2018-08-14: qty 30

## 2018-08-14 MED ORDER — SOD CITRATE-CITRIC ACID 500-334 MG/5ML PO SOLN: 30.0000 mL | ORAL | Status: DC | PRN

## 2018-08-14 MED ORDER — PHENYLEPHRINE 40 MCG/ML (10ML) SYRINGE FOR IV PUSH (FOR BLOOD PRESSURE SUPPORT)
80.0000 ug | PREFILLED_SYRINGE | INTRAVENOUS | Status: DC | PRN
  Filled 2018-08-14: qty 10

## 2018-08-14 MED ORDER — MISOPROSTOL 100 MCG PO TABS
25.0000 ug | ORAL_TABLET | ORAL | Status: DC | PRN
  Filled 2018-08-14: qty 1

## 2018-08-14 MED ORDER — LABETALOL HCL 200 MG PO TABS
200.0000 mg | ORAL_TABLET | Freq: Two times a day (BID) | ORAL | Status: DC
  Administered 2018-08-15 – 2018-08-16 (×3): 200 mg via ORAL
  Filled 2018-08-14 (×3): qty 1

## 2018-08-14 MED ORDER — LABETALOL HCL 5 MG/ML IV SOLN
40.0000 mg | INTRAVENOUS | Status: DC | PRN
  Filled 2018-08-14: qty 8

## 2018-08-14 MED ORDER — OXYTOCIN 40 UNITS IN NORMAL SALINE INFUSION - SIMPLE MED
2.5000 [IU]/h | INTRAVENOUS | Status: DC
  Administered 2018-08-14: 2.5 [IU]/h via INTRAVENOUS

## 2018-08-14 MED ORDER — LACTATED RINGERS IV SOLN: 500.0000 mL | INTRAVENOUS | Status: DC | PRN

## 2018-08-14 MED ORDER — LEVOTHYROXINE SODIUM 50 MCG PO TABS
50.0000 ug | ORAL_TABLET | Freq: Every day | ORAL | Status: DC
  Administered 2018-08-15 – 2018-08-16 (×2): 50 ug via ORAL
  Filled 2018-08-14 (×2): qty 1

## 2018-08-14 MED ORDER — LIDOCAINE HCL (PF) 1 % IJ SOLN
INTRAMUSCULAR | Status: DC | PRN
  Administered 2018-08-14: 1 mL

## 2018-08-14 MED ORDER — OXYTOCIN BOLUS FROM INFUSION: 500.0000 mL | Freq: Once | INTRAVENOUS | Status: DC

## 2018-08-14 MED ORDER — MISOPROSTOL 200 MCG PO TABS
ORAL_TABLET | ORAL | Status: AC
  Filled 2018-08-14: qty 4

## 2018-08-14 MED ORDER — LACTATED RINGERS IV SOLN: INTRAVENOUS | Status: DC

## 2018-08-14 MED ORDER — OXYTOCIN 10 UNIT/ML IJ SOLN
INTRAMUSCULAR | Status: AC
  Filled 2018-08-14: qty 2

## 2018-08-14 MED ORDER — OXYTOCIN 40 UNITS IN NORMAL SALINE INFUSION - SIMPLE MED
1.0000 m[IU]/min | INTRAVENOUS | Status: DC
  Administered 2018-08-14: 2 m[IU]/min via INTRAVENOUS

## 2018-08-14 MED ORDER — BUTORPHANOL TARTRATE 1 MG/ML IJ SOLN: 1.0000 mg | INTRAMUSCULAR | Status: DC | PRN

## 2018-08-14 NOTE — Anesthesia Preprocedure Evaluation (Signed)
Anesthesia Evaluation  Patient identified by MRN, date of birth, ID band Patient awake    Reviewed: Allergy & Precautions, H&P , NPO status , Patient's Chart, lab work & pertinent test results  Airway Mallampati: II  TM Distance: >3 FB Neck ROM: full    Dental  (+) Teeth Intact   Pulmonary neg pulmonary ROS,           Cardiovascular Exercise Tolerance: Good hypertension (chronic HTN), On Medications      Neuro/Psych    GI/Hepatic GERD  ,  Endo/Other  Hypothyroidism   Renal/GU   negative genitourinary   Musculoskeletal   Abdominal   Peds  Hematology negative hematology ROS (+)   Anesthesia Other Findings Obese Past Medical History: No date: GERD (gastroesophageal reflux disease) No date: Thyroid disease  Past Surgical History: 12/03/2015: CHOLECYSTECTOMY; N/A     Comment:  Procedure: LAPAROSCOPIC CHOLECYSTECTOMY WITH               INTRAOPERATIVE CHOLANGIOGRAM;  Surgeon: Christene Lye, MD;  Location: [facility] ORS;  Service: General;                Laterality: N/A; No date: TONSILLECTOMY  BMI    Body Mass Index: 37.02 kg/m      Reproductive/Obstetrics (+) Pregnancy                             Anesthesia Physical Anesthesia Plan  ASA: III  Anesthesia Plan: Epidural   Post-op Pain Management:    Induction:   PONV Risk Score and Plan:   Airway Management Planned:   Additional Equipment:   Intra-op Plan:   Post-operative Plan:   Informed Consent: I have reviewed the patients History and Physical, chart, labs and discussed the procedure including the risks, benefits and alternatives for the proposed anesthesia with the patient or authorized representative who has indicated his/her understanding and acceptance.       Plan Discussed with: Anesthesiologist  Anesthesia Plan Comments:         Anesthesia Quick Evaluation

## 2018-08-14 NOTE — H&P (Signed)
Miranda Briggs is a 28 y.o. female presenting for Chronic HTN  On aldomet and elevated BP in office . EGA 38+1 Pregnancy issue :  1. Chronic HTN  2. A1GDM  3. Hypothyroidism   4. Obesity  OB History    Gravida  2   Para  1   Term  1   Preterm      AB      Living  1     SAB      TAB      Ectopic      Multiple      Live Births  1        Obstetric Comments  1st Menstrual Cycle:  11 1st Pregnancy:  23        Past Medical History:  Diagnosis Date  . GERD (gastroesophageal reflux disease)   . Thyroid disease    Past Surgical History:  Procedure Laterality Date  . CHOLECYSTECTOMY N/A 12/03/2015   Procedure: LAPAROSCOPIC CHOLECYSTECTOMY WITH INTRAOPERATIVE CHOLANGIOGRAM;  Surgeon: Christene Lye, MD;  Location: [facility] ORS;  Service: General;  Laterality: N/A;  . TONSILLECTOMY     Family History: family history includes Diabetes in her father. Social History:  reports that she has never smoked. She has never used smokeless tobacco. She reports previous alcohol use. She reports that she does not use drugs.     Maternal Diabetes: Yes:  Diabetes Type:  Diet controlled Genetic Screening: Declined Maternal Ultrasounds/Referrals: Normal Fetal Ultrasounds or other Referrals:  None Maternal Substance Abuse:  No Significant Maternal Medications:  Synthroid 50 mcg  Significant Maternal Lab Results:  Group B Strep negative Other Comments:  None,  ROS History Dilation: 6 Effacement (%): 90 Station: 0 Exam by:: Schemerhorn, MD Blood pressure 103/70, pulse (!) 102, temperature 98.3 F (36.8 C), temperature source Oral, resp. rate 18, height 5\' 3"  (1.6 m), weight 94.8 kg, last menstrual period 11/08/2017, SpO2 99 %. Exam  Lungs CTA  CV RRR  Pelvic as above  AROM - clear   Physical Exam  Prenatal labs: ABO, Rh: --/--/A POS (08/10 1850) Antibody: NEG (08/10 1850) Rubella:  Imm/ V Imm RPR:   neg HBsAg:neg    HIV:   Neg GBS:   neg , Covid neg    Assessment/Plan: Chtn   Advanced cervical dilation  Admit  AROM and anticipate SVD    Miranda Briggs 08/14/2018, 9:44 PM

## 2018-08-14 NOTE — Anesthesia Procedure Notes (Addendum)
Epidural Patient location during procedure: OB  Staffing Anesthesiologist: Durenda Hurt, MD Performed: anesthesiologist   Preanesthetic Checklist Completed: patient identified, site marked, surgical consent, pre-op evaluation, timeout performed, IV checked, risks and benefits discussed and monitors and equipment checked  Epidural Patient position: sitting Prep: ChloraPrep Patient monitoring: heart rate, continuous pulse ox and blood pressure Approach: midline Location: L4-L5 Injection technique: LOR saline  Needle:  Needle type: Tuohy  Needle gauge: 18 G Needle length: 9 cm and 9 Needle insertion depth: 6 cm Catheter type: closed end flexible Catheter size: 20 Guage Catheter at skin depth: 11 cm Test dose: negative and Other  Assessment Events: blood not aspirated, injection not painful, no injection resistance, negative IV test and no paresthesia  Additional Notes  Negative dural puncture.  Negative aspiration.  Negative paresthesia on injection.  Dose given in divided aliquots. Patient tolerated the insertion well without complications.Reason for block:procedure for pain

## 2018-08-14 NOTE — Discharge Summary (Signed)
Obstetrical Discharge Summary  Patient Name: Miranda Briggs DOB: Apr 04, 1990 MRN: [id number]  Date of Admission: 08/14/2018 Date of Delivery: 08/14/2018 Delivered by: Huel Cote MD Date of Discharge: 08/16/2018  Primary OB: Lawrence YCX:KGYJEHU'D last menstrual period was 11/08/2017 (approximate). EDC Estimated Date of Delivery: 08/27/18 Gestational Age at Delivery: [redacted]w[redacted]d   Antepartum complications: 1. Prepregnancy BMI 37 2. Hypothyroidism, currently on 75mcg Synthroid daily 3. Chronic hypertension on methyldopa 250mg  TID 4. History hrHPV+ 5. Gestational diabetes, diet controlled 6. Anemia on iron supplement   Admitting Diagnosis: Advanced cervical dilation  Secondary Diagnosis: Patient Active Problem List   Diagnosis Date Noted  . Labor and delivery indication for care or intervention 08/14/2018  . 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 Encounter for supervision of high risk pregnancy in second trimester, antepartum 02/03/2018  . Pre-existing hypertension during pregnancy 01/10/2018  . Acquired hypothyroidism 01/10/2018  . Female fertility problem 10/03/2017  . Encounter for general adult medical examination with abnormal findings 09/16/2017  . Irregular menstrual cycle 09/16/2017  . Absence of menstruation 09/16/2017  . Vitamin D deficiency 09/16/2017  . Needs flu shot 09/16/2017  . Dysuria 09/16/2017  . Subclinical hypothyroidism 09/16/2017  . Elevated blood pressure reading without diagnosis of hypertension 09/16/2017  . GERD (gastroesophageal reflux disease) 07/01/2017  . Generalized abdominal pain 07/01/2017  . Irritable bowel syndrome with diarrhea 07/01/2017    Augmentation: AROM and Pitocin Complications: None Intrapartum complications/course: see delivery note Date of Delivery: 08/14/2018 Delivered By: Huel Cote MD Delivery Type: spontaneous vaginal delivery Anesthesia: epidural Placenta: spontaneous Laceration: superior  periurethral laceration and second degree perineal lac  Episiotomy: none  Newborn Data: Live born female  Birth Weight: 3270g 7lb 3.3oz  APGAR: 9, 9  Newborn Delivery   Birth date/time: 08/14/2018 22:46:00 Delivery type: Vaginal, Spontaneous     Post partum course:  Patient had an uncomplicated postpartum course.  By time of discharge on PPD#2, her pain was controlled on oral pain medications; she had appropriate lochia and was ambulating, voiding without difficulty and tolerating regular diet.  She was deemed stable for discharge to home.     Discharge Physical Exam:  BP 116/77 (BP Location: Left Arm)   Pulse 74   Temp 98 F (36.7 C)   Resp 18   Ht 5\' 3"  (1.6 m)   Wt 94.8 kg   LMP 11/08/2017 (Approximate)   SpO2 98%   BMI 37.02 kg/m   General: NAD CV: RRR Pulm: CTABL, nl effort ABD: s/nd/nt, fundus firm and below the umbilicus Lochia: moderate DVT Evaluation: LE non-ttp, no evidence of DVT on exam.  Hemoglobin  Date Value Ref Range Status  08/15/2018 10.5 (L) 12.0 - 15.0 g/dL Final  09/19/2017 14.1 11.1 - 15.9 g/dL Final   HCT  Date Value Ref Range Status  08/15/2018 30.6 (L) 36.0 - 46.0 % Final   Hematocrit  Date Value Ref Range Status  09/19/2017 40.1 34.0 - 46.6 % Final     Disposition: stable, discharge to home. Baby Feeding: breastmilk Baby Disposition: home with mom  Rh Immune globulin given: n/a Rubella vaccine given: n/a  Tdap vaccine given in AP or PP setting: 06/08/18 Flu vaccine given in AP or PP setting: 09/22/17  Contraception: TBD  Prenatal Labs:  Blood type/Rh A+  Antibody screen neg  Rubella Immune  Varicella Immune  RPR NR  HBsAg Neg  HIV NR  GC neg  Chlamydia neg  Genetic screening declined  1 hour GTT 214  3 hour GTT  66, 213, 170, 133  GBS negative  COVID-19 negative   Plan:  Ellar Hakala was discharged to home in good condition. Follow-up appointment with delivering provider in 6 weeks.  Discharge  Medications: Allergies as of 08/16/2018      Reactions   Penicillins Hives   Has patient had a PCN reaction causing immediate rash, facial/tongue/throat swelling, SOB or lightheadedness with hypotension:No Has patient had a PCN reaction causing severe rash involving mucus membranes or skin necrosis:No Has patient had a PCN reaction that required hospitalization:No Has patient had a PCN reaction occurring within the last 10 years:Yes If all of the above answers are "NO", then may proceed with Cephalosporin use.      Medication List    STOP taking these medications   Aleve 220 MG tablet Generic drug: naproxen sodium   aspirin 81 MG tablet   atenolol 25 MG tablet Commonly known as: TENORMIN   Contour Next Test test strip Generic drug: glucose blood   fluticasone 50 MCG/ACT nasal spray Commonly known as: FLONASE   IRON PO   methyldopa 250 MG tablet Commonly known as: ALDOMET   omeprazole 40 MG capsule Commonly known as: PRILOSEC   Prenatal 28-0.8 MG Tabs     TAKE these medications   labetalol 200 MG tablet Commonly known as: NORMODYNE Take 1 tablet (200 mg total) by mouth 2 (two) times daily.   levothyroxine 50 MCG tablet Commonly known as: SYNTHROID Take 1 tablet by mouth.       Follow-up Information    Schermerhorn, Gwen Her, MD Follow up in 2 week(s).   Specialty: Obstetrics and Gynecology Contact information: [address] [place] [address] [phone]           Signed: Nelson, CNM 08/16/2018 9:03 AM

## 2018-08-15 ENCOUNTER — Ambulatory Visit
Admission: RE | Admit: 2018-08-15 | Discharge: 2018-08-15 | Disposition: A | Discharge status: Home / Self Care | Payer: BLUE CROSS/BLUE SHIELD | Source: Ambulatory Visit | Attending: Nurse Practitioner | Admitting: Nurse Practitioner

## 2018-08-15 LAB — CBC
HCT: 30.6 % — ABNORMAL LOW (ref 36.0–46.0)
Hemoglobin: 10.5 g/dL — ABNORMAL LOW (ref 12.0–15.0)
MCH: 30.9 pg (ref 26.0–34.0)
MCHC: 34.3 g/dL (ref 30.0–36.0)
MCV: 90 fL (ref 80.0–100.0)
Platelets: 220 10*3/uL (ref 150–400)
RBC: 3.4 MIL/uL — ABNORMAL LOW (ref 3.87–5.11)
RDW: 12.7 % (ref 11.5–15.5)
WBC: 12.2 10*3/uL — ABNORMAL HIGH (ref 4.0–10.5)
nRBC: 0 % (ref 0.0–0.2)

## 2018-08-15 MED ORDER — MAGNESIUM HYDROXIDE 400 MG/5ML PO SUSP: 30.0000 mL | ORAL | Status: DC | PRN

## 2018-08-15 MED ORDER — COCONUT OIL OIL
1.0000 "application " | TOPICAL_OIL | Status: DC | PRN
  Filled 2018-08-15: qty 120

## 2018-08-15 MED ORDER — ZOLPIDEM TARTRATE 5 MG PO TABS: 5.0000 mg | ORAL_TABLET | Freq: Every evening | ORAL | Status: DC | PRN

## 2018-08-15 MED ORDER — IBUPROFEN 600 MG PO TABS
600.0000 mg | ORAL_TABLET | Freq: Four times a day (QID) | ORAL | Status: DC
  Administered 2018-08-15 – 2018-08-16 (×5): 600 mg via ORAL
  Filled 2018-08-15 (×5): qty 1

## 2018-08-15 MED ORDER — ONDANSETRON HCL 4 MG/2ML IJ SOLN: 4.0000 mg | INTRAMUSCULAR | Status: DC | PRN

## 2018-08-15 MED ORDER — DIPHENHYDRAMINE HCL 25 MG PO CAPS: 25.0000 mg | ORAL_CAPSULE | Freq: Four times a day (QID) | ORAL | Status: DC | PRN

## 2018-08-15 MED ORDER — ACETAMINOPHEN 325 MG PO TABS: 650.0000 mg | ORAL_TABLET | ORAL | Status: DC | PRN

## 2018-08-15 MED ORDER — BENZOCAINE-MENTHOL 20-0.5 % EX AERO: 1.0000 "application " | INHALATION_SPRAY | CUTANEOUS | Status: DC | PRN

## 2018-08-15 MED ORDER — DIBUCAINE (PERIANAL) 1 % EX OINT: 1.0000 "application " | TOPICAL_OINTMENT | CUTANEOUS | Status: DC | PRN

## 2018-08-15 MED ORDER — MEASLES, MUMPS & RUBELLA VAC IJ SOLR
0.5000 mL | Freq: Once | INTRAMUSCULAR | Status: DC
  Filled 2018-08-15: qty 0.5

## 2018-08-15 MED ORDER — ONDANSETRON HCL 4 MG PO TABS: 4.0000 mg | ORAL_TABLET | ORAL | Status: DC | PRN

## 2018-08-15 MED ORDER — SENNOSIDES-DOCUSATE SODIUM 8.6-50 MG PO TABS
2.0000 | ORAL_TABLET | ORAL | Status: DC
  Administered 2018-08-15 – 2018-08-16 (×2): 2 via ORAL
  Filled 2018-08-15 (×2): qty 2

## 2018-08-15 MED ORDER — HYDROCODONE-ACETAMINOPHEN 5-325 MG PO TABS: 1.0000 | ORAL_TABLET | Freq: Four times a day (QID) | ORAL | Status: DC | PRN

## 2018-08-15 MED ORDER — SIMETHICONE 80 MG PO CHEW: 80.0000 mg | CHEWABLE_TABLET | ORAL | Status: DC | PRN

## 2018-08-15 MED ORDER — FERROUS SULFATE 325 (65 FE) MG PO TABS
325.0000 mg | ORAL_TABLET | Freq: Two times a day (BID) | ORAL | Status: DC
  Administered 2018-08-15 – 2018-08-16 (×3): 325 mg via ORAL
  Filled 2018-08-15 (×3): qty 1

## 2018-08-15 MED ORDER — WITCH HAZEL-GLYCERIN EX PADS: 1.0000 "application " | MEDICATED_PAD | CUTANEOUS | Status: DC | PRN

## 2018-08-15 MED ORDER — PRENATAL MULTIVITAMIN CH
1.0000 | ORAL_TABLET | Freq: Every day | ORAL | Status: DC
  Administered 2018-08-15: 1 via ORAL
  Filled 2018-08-15: qty 1

## 2018-08-15 MED ORDER — IBUPROFEN 600 MG PO TABS
600.0000 mg | ORAL_TABLET | Freq: Four times a day (QID) | ORAL | Status: DC
  Administered 2018-08-15: 600 mg via ORAL
  Filled 2018-08-15: qty 1

## 2018-08-15 NOTE — Lactation Note (Signed)
This note was copied from a baby's chart. Lactation Consultation Note  Patient Name: Girl Letonya Mangels XVEZB'M Date: 08/15/2018   [facility] spoke with mom this morning. Mom reports uneventful birth, and skin to skin with breastfeeding shortly after delivering. She reports that baby latched "well", and stayed on breast for a good amount of time, unsure of exact minutes.   Mom reports a second feeding between 3:45am-4:00am, when baby latched and remained active for most of 15 minutes. Attempt to breastfeed again at 7:30am, but reports infant wasn't interested.  Mom reports this breastfeeding experience to be different than with her older child where she experienced nipple pain, and low supply- stopping breastfeeding just after the first week.  Praised mom for her dedication to breastfeeding her second baby. Reviewed with both mom and dad newborn stomach size, typical feeding patterns within the first 24 hours, early hunger and feeding cues, and wet/stool diapers. Encouraged skin to skin as much as possible to help with breastfeeding and encouragement of adequate milk supply.  [facility] name and number written on the whiteboard. Encouraged mom to call out when infant starts to show early hunger cues so LC can observe a position/feed; mom agrees.  Maternal Data    Feeding Feeding Type: Breast Fed  [facility] Score                   Interventions    Lactation Tools Discussed/Used     Consult Status      Lavonia Drafts 08/15/2018, 9:11 AM

## 2018-08-15 NOTE — Anesthesia Postprocedure Evaluation (Signed)
Anesthesia Post Note  Patient: Miranda Briggs  Procedure(s) Performed: AN AD HOC LABOR EPIDURAL  Patient location during evaluation: Mother Baby Anesthesia Type: Epidural Level of consciousness: awake, awake and alert and oriented Pain management: pain level controlled Vital Signs Assessment: post-procedure vital signs reviewed and stable Respiratory status: spontaneous breathing, nonlabored ventilation and respiratory function stable Cardiovascular status: blood pressure returned to baseline and stable Postop Assessment: no headache and no backache Anesthetic complications: no     Last Vitals:  Vitals:   08/15/18 0241 08/15/18 0334  BP: (!) 127/93 131/86  Pulse: 77 66  Resp: 18 18  Temp: 36.8 C 36.9 C  SpO2: 98% 99%    Last Pain:  Vitals:   08/15/18 0334  TempSrc: Oral  PainSc:                  Johnna Acosta

## 2018-08-15 NOTE — Lactation Note (Signed)
This note was copied from a baby's chart. Lactation Consultation Note  Patient Name: Miranda Briggs GYBWL'S Date: 08/15/2018 Reason for consult: Mother's request  LC called by MOB for assistance with feed. Baby has been sleepy since last good feed around 3:40am, but has had multiple wet/stool diapers. Mom had baby positioned in cradle position on left breast. Infants tummy was turned out slightly; assisted with repositioning infant- helped turn baby in towards mom- alignment of ear, shoulder, and hip.  Assisted with holding tissue to help baby with latch. Infant was able to grasp breast and displayed flanged top and bottom lips. Infant remained at the breast, but only sucked about 4 times, and fell asleep attached to breast. Multiple attempts to rouse baby from sleep was unsuccessful.  While infant was on left breast, LC showed mom how to hand express on right side with colostrum evident. Reviewed early hunger cues, and newborn stomach size, wet/stool diapers. Encouraged continuing to put baby to breast on cue, place baby to skin to skin as much as possible.  Mom plans to call [facility] at next feeding attempt.  Maternal Data Formula Feeding for Exclusion: No Has patient been taught Hand Expression?: Yes Does the patient have breastfeeding experience prior to this delivery?: Yes  Feeding Feeding Type: Breast Fed  LATCH Score Latch: Repeated attempts needed to sustain latch, nipple held in mouth throughout feeding, stimulation needed to elicit sucking reflex.  Audible Swallowing: A few with stimulation  Type of Nipple: Everted at rest and after stimulation  Comfort (Breast/Nipple): Soft / non-tender  Hold (Positioning): Assistance needed to correctly position infant at breast and maintain latch.  LATCH Score: 7  Interventions Interventions: Breast feeding basics reviewed;Assisted with latch;Hand express;Adjust position  Lactation Tools Discussed/Used     Consult  Status Consult Status: Follow-up Date: 08/15/18 Follow-up type: [place] 08/15/2018, 12:41 PM

## 2018-08-15 NOTE — Progress Notes (Signed)
Post Partum Day 1  Subjective: Doing well, no concerns. Ambulating without difficulty, pain managed with PO meds, tolerating regular diet, and voiding without difficulty.   No fever/chills, chest pain, shortness of breath, nausea/vomiting, or leg pain. No nipple or breast pain.   Objective: BP 131/86 (BP Location: Right Arm)   Pulse 66   Temp 98.5 F (36.9 C) (Oral)   Resp 18   Ht 5\' 3"  (1.6 m)   Wt 94.8 kg   LMP 11/08/2017 (Approximate)   SpO2 99%   BMI 37.02 kg/m    Physical Exam:  General: alert, cooperative, appears stated age and no distress Breasts: soft/nontender CV: RRR Pulm: nl effort, CTABL Abdomen: soft, non-tender, active bowel sounds Uterine Fundus: firm Lochia: appropriate DVT Evaluation: No evidence of DVT seen on physical exam. No cords or calf tenderness. No significant calf/ankle edema.  Recent Labs    08/14/18 1850 08/15/18 0536  HGB 12.0 10.5*  HCT 34.2* 30.6*  WBC 10.2 12.2*  PLT 281 220    Assessment/Plan: 28 y.o. G2P1001 postpartum day # 1  -Continue routine postpartum care -Lactation consult PRN for breastfeeding  -Acute blood loss anemia - hemodynamically stable and asymptomatic; continue PNV with iron   -Immunization status: all immunizations up to date  Disposition: Continue inpatient postpartum care, desires discharge at 24 hours postpartum if baby cleared for discharge   LOS: 1 day   Lisette Grinder, CNM 08/15/2018, 8:46 AM   ----- Lisette Grinder Certified Nurse Midwife [facility]

## 2018-08-16 LAB — RPR: RPR Ser Ql: NONREACTIVE

## 2018-08-16 MED ORDER — LABETALOL HCL 200 MG PO TABS: 200.0000 mg | ORAL_TABLET | Freq: Two times a day (BID) | ORAL | 3 refills | Status: DC

## 2018-08-16 NOTE — Lactation Note (Signed)
This note was copied from a baby's chart. Lactation Consultation Note  Patient Name: Miranda Briggs NOIBB'C Date: 08/16/2018 Reason for consult: Follow-up assessment  LC spoke with parents about breastfeeding over night. Parents report cluster feeding began around 9pm, with feeds lasting 11-25 minutes, and then short feedings began around 5am this morning, 5-7 minutes in duration. Mom reports some soreness, and slight abrasions to nipples. Coconut oil has been given, discussed position and latch of baby- ensuring that [employer] with wide open mouth, nose to nipple, and flange top and bottom lips. Reviewed with parents what to expect with breastfeeding in the coming days, upcoming growth spurts, repeat cluster feedings, and wet/stool diaper expectations. Mom asked questions regarding onset of mature milk; reviewed physiology of breastmilk; colostrum to transition to mature milk, average timing. Discussed breast fullness, engorgement, management and breast and nipple care. Provided information for breastfeeding support after discharge including out patient lactation consultations, and virtual breastfeeding support groups. Encouraged parents to call with any questions or concerns regarding breastfeeding.  Maternal Data Formula Feeding for Exclusion: No Has patient been taught Hand Expression?: Yes Does the patient have breastfeeding experience prior to this delivery?: Yes  Feeding Feeding Type: Breast Fed  LATCH Score                   Interventions Interventions: Breast feeding basics reviewed  Lactation Tools Discussed/Used     Consult Status Consult Status: Complete Date: 08/16/18 Follow-up type: Call as needed    Lavonia Drafts 08/16/2018, 9:52 AM

## 2018-08-16 NOTE — Progress Notes (Signed)
Patient discharged home with infant. Discharge instructions and prescriptions given and reviewed with patient. Patient verbalized understanding. Escorted out by staff.

## 2018-08-16 NOTE — Discharge Instructions (Signed)
Discharge instructions:   Call office if you have any of the following: headache, visual changes, fever >101.0 F, chills, shortness of breath, breast concerns, excessive vaginal bleeding, incision drainage or problems, leg pain or redness, depression or any other concerns.   Activity: Do not lift > 10 lbs for 6 weeks.  No intercourse or tampons for 6 weeks.  No driving for 1-2 weeks.   Call your doctor for increased pain or vaginal bleeding, temperature above 101.0, depression, or concerns.  No strenuous activity or heavy lifting for 6 weeks.  No intercourse, tampons, douching, or enemas for 6 weeks.  No tub baths-showers only.  No driving for 2 weeks or while taking pain medications.  Continue prenatal vitamin and iron.  Increase calories and fluids while breastfeeding.  You may have a slight fever when your milk comes in, but it should go away on its own.  If it does not, and rises above 101.0 please call the doctor.  For concerns about your baby, please call your pediatrician For breastfeeding concerns, the lactation consultant can be reached at [phone]

## 2018-08-30 DIAGNOSIS — I10 Essential (primary) hypertension: Secondary | ICD-10-CM | POA: Diagnosis not present

## 2018-09-26 DIAGNOSIS — Z124 Encounter for screening for malignant neoplasm of cervix: Secondary | ICD-10-CM | POA: Diagnosis not present

## 2018-09-26 DIAGNOSIS — O99283 Endocrine, nutritional and metabolic diseases complicating pregnancy, third trimester: Secondary | ICD-10-CM | POA: Diagnosis not present

## 2018-09-26 DIAGNOSIS — Z3200 Encounter for pregnancy test, result unknown: Secondary | ICD-10-CM | POA: Diagnosis not present

## 2018-09-26 DIAGNOSIS — Z1151 Encounter for screening for human papillomavirus (HPV): Secondary | ICD-10-CM | POA: Diagnosis not present

## 2018-09-26 DIAGNOSIS — Z3009 Encounter for other general counseling and advice on contraception: Secondary | ICD-10-CM | POA: Diagnosis not present

## 2018-09-26 DIAGNOSIS — E039 Hypothyroidism, unspecified: Secondary | ICD-10-CM | POA: Diagnosis not present

## 2018-09-26 DIAGNOSIS — Z30430 Encounter for insertion of intrauterine contraceptive device: Secondary | ICD-10-CM | POA: Diagnosis not present

## 2018-09-28 DIAGNOSIS — E039 Hypothyroidism, unspecified: Secondary | ICD-10-CM | POA: Diagnosis not present

## 2018-10-31 DIAGNOSIS — D060 Carcinoma in situ of endocervix: Secondary | ICD-10-CM | POA: Diagnosis not present

## 2018-10-31 DIAGNOSIS — Z30431 Encounter for routine checking of intrauterine contraceptive device: Secondary | ICD-10-CM | POA: Diagnosis not present

## 2018-10-31 DIAGNOSIS — N870 Mild cervical dysplasia: Secondary | ICD-10-CM | POA: Diagnosis not present

## 2018-10-31 DIAGNOSIS — R87810 Cervical high risk human papillomavirus (HPV) DNA test positive: Secondary | ICD-10-CM | POA: Diagnosis not present

## 2018-11-13 DIAGNOSIS — D069 Carcinoma in situ of cervix, unspecified: Secondary | ICD-10-CM | POA: Diagnosis not present

## 2018-11-13 DIAGNOSIS — I10 Essential (primary) hypertension: Secondary | ICD-10-CM | POA: Diagnosis not present

## 2018-11-14 ENCOUNTER — Encounter: Payer: Self-pay | Admitting: Internal Medicine

## 2018-11-14 ENCOUNTER — Ambulatory Visit: Payer: BLUE CROSS/BLUE SHIELD | Admitting: Internal Medicine

## 2018-11-14 ENCOUNTER — Other Ambulatory Visit: Payer: Self-pay

## 2018-11-14 DIAGNOSIS — D069 Carcinoma in situ of cervix, unspecified: Secondary | ICD-10-CM

## 2018-11-14 DIAGNOSIS — E039 Hypothyroidism, unspecified: Secondary | ICD-10-CM

## 2018-11-14 DIAGNOSIS — I10 Essential (primary) hypertension: Secondary | ICD-10-CM

## 2018-11-14 MED ORDER — BISOPROLOL-HYDROCHLOROTHIAZIDE 2.5-6.25 MG PO TABS: 1.0000 | ORAL_TABLET | Freq: Every day | ORAL | 3 refills | Status: DC

## 2018-11-14 NOTE — Progress Notes (Signed)
[facility] [place], [place] [address]  Internal MEDICINE  Telephone Visit  Patient Name: Miranda Briggs  [id number]  [id number]  Date of Service: 11/17/2018  I connected with the patient at 12:05 by telephone and verified the patients identity using two identifiers.   I discussed the limitations, risks, security and privacy concerns of performing an evaluation and management service by telephone and the availability of in person appointments. I also discussed with the patient that there may be a patient responsible charge related to the service.  The patient expressed understanding and agrees to proceed.    Chief Complaint  Patient presents with  . Telephone Assessment  . Telephone Screen  . Hypertension    this morning BP    HPI Pt is connected due to concerns of elevated blood pressure from OBGYN office. 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 She was advised by them that she will need vaginal hysterectomy. She was treated with Labetalol during her pregnancy but has stopped since then. She reached out today for treatment of hypertension before her surgery   Denies any headaches, nausea or vomiting no c/p or sob, she is not nursing at the time   Current Medication: Outpatient Encounter Medications as of 11/14/2018  Medication Sig  . levothyroxine (SYNTHROID, LEVOTHROID) 50 MCG tablet Take 1 tablet by mouth.  . bisoprolol-hydrochlorothiazide (ZIAC) 2.5-6.25 MG tablet Take 1 tablet by mouth daily.  . [DISCONTINUED] labetalol (NORMODYNE) 200 MG tablet Take 1 tablet (200 mg total) by mouth 2 (two) times daily. (Patient not taking: Reported on 11/14/2018)   No facility-administered encounter medications on file as of 11/14/2018.     Surgical History: Past Surgical History:  Procedure Laterality Date  . CHOLECYSTECTOMY N/A 12/03/2015   Procedure: LAPAROSCOPIC CHOLECYSTECTOMY WITH INTRAOPERATIVE  CHOLANGIOGRAM;  Surgeon: Christene Lye, MD;  Location: [facility] ORS;  Service: General;  Laterality: N/A;  . TONSILLECTOMY      Medical History: Past Medical History:  Diagnosis Date  . GERD (gastroesophageal reflux disease)   . Thyroid disease     Family History: Family History  Problem Relation Age of Onset  . Diabetes Father     Social History   Socioeconomic History  . Marital status: Married    Spouse name: Not on file  . Number of children: Not on file  . Years of education: Not on file  . Highest education level: Not on file  Occupational History  . Not on file  Social Needs  . Financial resource strain: Not on file  . Food insecurity    Worry: Not on file    Inability: Not on file  . Transportation needs    Medical: Not on file    Non-medical: Not on file  Tobacco Use  . Smoking status: Never Smoker  . Smokeless tobacco: Never Used  Substance and Sexual Activity  . Alcohol use: Not Currently    Comment: OCC WINE  . Drug use: No  . Sexual activity: Not on file  Lifestyle  . Physical activity    Days per week: Not on file    Minutes per session: Not on file  . Stress: Not on file  Relationships  . Social Herbalist on phone: Not on file    Gets together: Not on file    Attends religious service: Not on file    Active member of club or organization: Not on file  Attends meetings of clubs or organizations: Not on file    Relationship status: Not on file  . Intimate partner violence    Fear of current or ex partner: Not on file    Emotionally abused: Not on file    Physically abused: Not on file    Forced sexual activity: Not on file  Other Topics Concern  . Not on file  Social History Narrative  . Not on file    Review of Systems  Constitutional: Negative for chills, diaphoresis and fatigue.  HENT: Negative for ear pain, postnasal drip and sinus pressure.   Eyes: Negative for photophobia, discharge, redness, itching and visual  disturbance.  Respiratory: Negative for cough, shortness of breath and wheezing.   Cardiovascular: Negative for chest pain, palpitations and leg swelling.  Gastrointestinal: Negative for abdominal pain, constipation, diarrhea, nausea and vomiting.  Genitourinary: Negative for dysuria and flank pain.  Musculoskeletal: Negative for arthralgias, back pain, gait problem and neck pain.  Skin: Negative for color change.  Allergic/Immunologic: Negative for environmental allergies and food allergies.  Neurological: Negative for dizziness and headaches.  Hematological: Does not bruise/bleed easily.  Psychiatric/Behavioral: Negative for agitation, behavioral problems (depression) and hallucinations.    Vital Signs: BP (!) 125/98   Ht 5\' 3"  (1.6 m)   Wt 200 lb (90.7 kg)   Breastfeeding No   BMI 35.43 kg/m    Observation/Objective: Pt is very pleasant to talk to today. NAD  Assessment/Plan: 1. Essential hypertension, benign - Start low dose B blocker, will titrate if heart rate can tolerate . DASH diet  - bisoprolol-hydrochlorothiazide (ZIAC) 2.5-6.25 MG tablet; Take 1 tablet by mouth daily.  Dispense: 30 tablet; Refill: 3  2. Hypothyroidism, unspecified type - Noticed that her TSH is normal high. She will get benefit to increase synthroid to 75 mcg, not sure she has hashimoto;s will follow up on next visit  3. Adenocarcinoma in situ (AIS) of uterine cervix - Pt is scheduled for vaginal hysterectomy once bp is under better control   General Counseling: Jewelene verbalizes understanding of the findings of today's phone visit and agrees with plan of treatment. I have discussed any further diagnostic evaluation that may be needed or ordered today. We also reviewed her medications today. she has been encouraged to call the office with any questions or concerns that should arise related to todays visit.   Meds ordered this encounter  Medications  . bisoprolol-hydrochlorothiazide (ZIAC) 2.5-6.25  MG tablet    Sig: Take 1 tablet by mouth daily.    Dispense:  30 tablet    Refill:  3    Time spent: 59 Minutes  Dr Lavera Guise Internal medicine

## 2018-11-20 ENCOUNTER — Telehealth: Payer: Self-pay

## 2018-11-20 NOTE — Telephone Encounter (Signed)
Confirmed appointment with patient. klh °

## 2018-11-22 ENCOUNTER — Ambulatory Visit: Payer: BLUE CROSS/BLUE SHIELD | Admitting: Adult Health

## 2018-11-22 DIAGNOSIS — R87810 Cervical high risk human papillomavirus (HPV) DNA test positive: Secondary | ICD-10-CM | POA: Diagnosis not present

## 2018-11-22 DIAGNOSIS — D069 Carcinoma in situ of cervix, unspecified: Secondary | ICD-10-CM | POA: Diagnosis not present

## 2018-11-23 ENCOUNTER — Telehealth: Payer: Self-pay

## 2018-11-23 NOTE — Telephone Encounter (Signed)
Patient was scheduled for an office visit on 11-22-18. She forgot about her appointment mistakenly and apologized as she got tied up at work and didn't even realize until after 5:00 after the office had closed. Rescheduled her for 11-27-18 at 3:00.

## 2018-11-23 NOTE — Telephone Encounter (Signed)
BILLED MISSED APPT FEE 11/22/18.

## 2018-11-27 ENCOUNTER — Other Ambulatory Visit: Payer: Self-pay

## 2018-11-27 ENCOUNTER — Other Ambulatory Visit: Payer: BLUE CROSS/BLUE SHIELD

## 2018-11-27 ENCOUNTER — Ambulatory Visit: Payer: BLUE CROSS/BLUE SHIELD | Admitting: Adult Health

## 2018-11-27 ENCOUNTER — Encounter: Payer: Self-pay | Admitting: Adult Health

## 2018-11-27 VITALS — BP 130/72 | HR 70 | Temp 97.4°F | Resp 16 | Ht 63.0 in | Wt 209.0 lb

## 2018-11-27 DIAGNOSIS — I10 Essential (primary) hypertension: Secondary | ICD-10-CM | POA: Diagnosis not present

## 2018-11-27 DIAGNOSIS — Z23 Encounter for immunization: Secondary | ICD-10-CM

## 2018-11-27 NOTE — Progress Notes (Signed)
[facility] [place], [place] [address]  Internal MEDICINE  Office Visit Note  Patient Name: Miranda Briggs  [id number]  [id number]  Date of Service: 11/27/2018  Chief Complaint  Patient presents with  . Medical Management of Chronic Issues    1 week follow up   . Hypertension    HPI  Pt is here for follow up on Hypertension. Her bp remains slightly elevated today, initially 139/99, however at recheck it was 130/72. She was stressing to get here on time today. She Denies Chest pain, Shortness of breath, palpitations, headache, or blurred vision.     Current Medication: Outpatient Encounter Medications as of 11/27/2018  Medication Sig  . acetaminophen (TYLENOL) 500 MG tablet Take 1,000 mg by mouth every 6 (six) hours as needed for moderate pain or headache.  . bisoprolol-hydrochlorothiazide (ZIAC) 2.5-6.25 MG tablet Take 1 tablet by mouth daily. (Patient taking differently: Take 1 tablet by mouth every evening. )  . levothyroxine (SYNTHROID, LEVOTHROID) 50 MCG tablet Take 50 mcg by mouth daily.   . Multiple Vitamin (MULTIVITAMIN WITH MINERALS) TABS tablet Take 1 tablet by mouth every evening.   No facility-administered encounter medications on file as of 11/27/2018.     Surgical History: Past Surgical History:  Procedure Laterality Date  . CHOLECYSTECTOMY N/A 12/03/2015   Procedure: LAPAROSCOPIC CHOLECYSTECTOMY WITH INTRAOPERATIVE CHOLANGIOGRAM;  Surgeon: Christene Lye, MD;  Location: [facility] ORS;  Service: General;  Laterality: N/A;  . TONSILLECTOMY      Medical History: Past Medical History:  Diagnosis Date  . GERD (gastroesophageal reflux disease)   . Thyroid disease     Family History: Family History  Problem Relation Age of Onset  . Diabetes Father     Social History   Socioeconomic History  . Marital status: Married    Spouse name: Not on file  . Number of children: Not on file  . Years of education: Not on file  .  Highest education level: Not on file  Occupational History  . Not on file  Social Needs  . Financial resource strain: Not on file  . Food insecurity    Worry: Not on file    Inability: Not on file  . Transportation needs    Medical: Not on file    Non-medical: Not on file  Tobacco Use  . Smoking status: Never Smoker  . Smokeless tobacco: Never Used  Substance and Sexual Activity  . Alcohol use: Not Currently    Comment: OCC WINE  . Drug use: No  . Sexual activity: Not on file  Lifestyle  . Physical activity    Days per week: Not on file    Minutes per session: Not on file  . Stress: Not on file  Relationships  . Social Herbalist on phone: Not on file    Gets together: Not on file    Attends religious service: Not on file    Active member of club or organization: Not on file    Attends meetings of clubs or organizations: Not on file    Relationship status: Not on file  . Intimate partner violence    Fear of current or ex partner: Not on file    Emotionally abused: Not on file    Physically abused: Not on file    Forced sexual activity: Not on file  Other Topics Concern  . Not on file  Social History Narrative  . Not on file  Review of Systems  Constitutional: Negative for chills, fatigue and unexpected weight change.  HENT: Negative for congestion, rhinorrhea, sneezing and sore throat.   Eyes: Negative for photophobia, pain and redness.  Respiratory: Negative for cough, chest tightness and shortness of breath.   Cardiovascular: Negative for chest pain and palpitations.  Gastrointestinal: Negative for abdominal pain, constipation, diarrhea, nausea and vomiting.  Endocrine: Negative.   Genitourinary: Negative for dysuria and frequency.  Musculoskeletal: Negative for arthralgias, back pain, joint swelling and neck pain.  Skin: Negative for rash.  Allergic/Immunologic: Negative.   Neurological: Negative for tremors and numbness.  Hematological:  Negative for adenopathy. Does not bruise/bleed easily.  Psychiatric/Behavioral: Negative for behavioral problems and sleep disturbance. The patient is not nervous/anxious.     Vital Signs: BP (!) 139/99   Pulse 70   Temp (!) 97.4 F (36.3 C)   Resp 16   Ht 5\' 3"  (1.6 m)   Wt 209 lb (94.8 kg)   SpO2 97%   BMI 37.02 kg/m    Physical Exam Vitals signs and nursing note reviewed.  Constitutional:      General: She is not in acute distress.    Appearance: She is well-developed. She is not diaphoretic.  HENT:     Head: Normocephalic and atraumatic.     Mouth/Throat:     Pharynx: No oropharyngeal exudate.  Eyes:     Pupils: Pupils are equal, round, and reactive to light.  Neck:     Musculoskeletal: Normal range of motion and neck supple.     Thyroid: No thyromegaly.     Vascular: No JVD.     Trachea: No tracheal deviation.  Cardiovascular:     Rate and Rhythm: Normal rate and regular rhythm.     Heart sounds: Normal heart sounds. No murmur. No friction rub. No gallop.   Pulmonary:     Effort: Pulmonary effort is normal. No respiratory distress.     Breath sounds: Normal breath sounds. No wheezing or rales.  Chest:     Chest wall: No tenderness.  Abdominal:     Palpations: Abdomen is soft.     Tenderness: There is no abdominal tenderness. There is no guarding.  Musculoskeletal: Normal range of motion.  Lymphadenopathy:     Cervical: No cervical adenopathy.  Skin:    General: Skin is warm and dry.  Neurological:     Mental Status: She is alert and oriented to person, place, and time.     Cranial Nerves: No cranial nerve deficit.  Psychiatric:        Behavior: Behavior normal.        Thought Content: Thought content normal.        Judgment: Judgment normal.     Assessment/Plan: 1. Essential hypertension, benign Follow up in 4-6 weeks, check bp at home and keep log of pressure readings.   2. Flu vaccine need - Flu Vaccine MDCK QUAD PF  General Counseling: Miranda Briggs  verbalizes understanding of the findings of todays visit and agrees with plan of treatment. I have discussed any further diagnostic evaluation that may be needed or ordered today. We also reviewed her medications today. she has been encouraged to call the office with any questions or concerns that should arise related to todays visit.    Orders Placed This Encounter  Procedures  . Flu Vaccine MDCK QUAD PF    No orders of the defined types were placed in this encounter.   Time spent: 25 Minutes   This patient  was seen by Orson Gear AGNP-C in Collaboration with Dr Lavera Guise as a part of collaborative care agreement     Kendell Bane AGNP-C Internal medicine

## 2018-12-04 NOTE — H&P (Signed)
Miranda Briggs is a 28 y.o. female here for [facility] and bilateral salpingectomy . Pt here to discuss findings on her colposcopic cxbx on 10/31/18: . Pap with + HPV 16  Specimen A-ENDOCERVICAL ADENOCARCINOMA-IN-SITU. P16  IMMUNOSTAIN IS POSITIVE WITH A WORKING CONTROL. SQUAMOUS  MUCOSA IS NOT IDENTIFIED.  Specimen B-ENDOCERVICAL ADENOCARCINOMA-IN-SITU. LOW-GRADE  SQUAMOUS INTRAEPITHELIAL LESION (CIN 1). P16 IMMUNOSTAIN IS  POSITIVE IN THE ENDOCERVICAL MUCOSA WITH A WORKING CONTROL  she is s/p 2 SVD  She was on Labetalol during pregnancy and stopped .    Past Medical History:  has a past medical history of Elevated TSH and Hypertension.  Past Surgical History:  has a past surgical history that includes Tonsillectomy; Cholecystectomy; and wisdom teeth. Family History: family history includes Diabetes type II in her father; No Known Problems in her mother. Social History:  reports that she has never smoked. She has never used smokeless tobacco. She reports that she does not drink alcohol or use drugs. OB/GYN History:          OB History    Gravida  3   Para  2   Term  2   Preterm      AB  1   Living  1     SAB      TAB      Ectopic      Molar      Multiple      Live Births  1          Allergies: is allergic to penicillins. Medications:  Current Outpatient Medications:  .  bisoproloL-hydrochlorothiazide (ZIAC) 2.5-6.25 mg tablet, Take by mouth, Disp: , Rfl:  .  levonorgestreL (LILETTA) 19.5 mcg/24 hr (6 years) IUD, Insert 1 each into the uterus once Placed 09/26/18, Disp: , Rfl:  .  levothyroxine (SYNTHROID) 50 MCG tablet, Take 1 tablet (50 mcg total) by mouth once daily Take on an empty stomach with a glass of water at least 30-60 minutes before breakfast., Disp: 30 tablet, Rfl: 11  Review of Systems: General:                      No fatigue or weight loss Eyes:                           No vision changes Ears:                            No hearing  difficulty Respiratory:                No cough or shortness of breath Pulmonary:                  No asthma or shortness of breath Cardiovascular:           No chest pain, palpitations, dyspnea on exertion Gastrointestinal:          No abdominal bloating, chronic diarrhea, constipations, masses, pain or hematochezia Genitourinary:             No hematuria, dysuria, abnormal vaginal discharge, pelvic pain, Menometrorrhagia Lymphatic:                   No swollen lymph nodes Musculoskeletal:         No muscle weakness Neurologic:                  No extremity weakness,  syncope, seizure disorder Psychiatric:                  No history of depression, delusions or suicidal/homicidal ideation    Exam:      Vitals:   11/22/18 0959  BP: (!) 122/90  Pulse:     Body mass index is 36.49 kg/m.  WDWN white/  female in NAD   Lungs: CTA  CV : RRR without murmur   Breast: exam done in sitting and lying position : No dimpling or retraction, no dominant mass, no spontaneous discharge, no axillary adenopathy Neck:  no thyromegaly Abdomen: soft , no mass, normal active bowel sounds,  non-tender, no rebound tenderness Pelvic: tanner stage 5 ,  External genitalia: vulva /labia no lesions Urethra: no prolapse Vagina: normal physiologic d/c, adequate room for hysterectomy TVH Cervix: no lesions, no cervical motion tenderness, IUd strings visible Uterus: normal size shape and contour, non-tender Adnexa:no mass, non-tender   Impression:   The encounter diagnosis was Adenocarcinoma in situ (AIS) of uterine cervix.    Plan:   Pt has opted to undergo TVH and bilateral salpingectomy  Benefits and risks to surgery: The proposed benefit of the surgery has been discussed with the patient. The possible risks include, but are not limited to: organ injury to the bowel , bladder, ureters, and major blood vessels and nerves. There is a possibility of additional surgeries resulting  from these injuries. There is also the risk of blood transfusion and the need to receive blood products during or after the procedure which may rarely lead to HIV or Hepatitis C infection. There is a risk of developing a deep venous thrombosis or a pulmonary embolism . There is the possibility of wound infection and also anesthetic complications, even the rare possibility of death. The patient understands these risks and wishes to proceed.       Caroline Sauger, MD       Electronically signed by Caroline Sauger, MD on 11/22/2018 10:17 AM

## 2018-12-05 ENCOUNTER — Encounter
Admission: RE | Admit: 2018-12-05 | Discharge: 2018-12-05 | Disposition: A | Discharge status: Home / Self Care | Payer: BLUE CROSS/BLUE SHIELD | Source: Ambulatory Visit | Attending: Obstetrics and Gynecology | Admitting: Obstetrics and Gynecology

## 2018-12-05 ENCOUNTER — Other Ambulatory Visit: Payer: Self-pay

## 2018-12-05 HISTORY — DX: Hypothyroidism, unspecified: E03.9

## 2018-12-05 NOTE — Patient Instructions (Signed)
Your procedure is scheduled on Mon 12/7 Report to Day Surgery. To find out your arrival time please call [phone] between 1PM - 3PM on Frid 11/4.  Remember: Instructions that are not followed completely may result in serious medical risk,  up to and including death, or upon the discretion of your surgeon and anesthesiologist your  surgery may need to be rescheduled.     _X__ 1. Do not eat food after midnight the night before your procedure.                 No gum chewing or hard candies. You may drink clear liquids up to 2 hours                 before you are scheduled to arrive for your surgery- DO not drink clear                 liquids within 2 hours of the start of your surgery.                 Clear Liquids include:  water, apple juice without pulp, clear carbohydrate                 drink such as Clearfast of Gatorade, Black Coffee or Tea (Do not add                 anything to coffee or tea).  __X__2.  On the morning of surgery brush your teeth with toothpaste and water, you                may rinse your mouth with mouthwash if you wish.  Do not swallow any toothpaste of mouthwash.     _X__ 3.  No Alcohol for 24 hours before or after surgery.   ___ 4.  Do Not Smoke or use e-cigarettes For 24 Hours Prior to Your Surgery.                 Do not use any chewable tobacco products for at least 6 hours prior to                 surgery.  ____  5.  Bring all medications with you on the day of surgery if instructed.   __x__  6.  Notify your doctor if there is any change in your medical condition      (cold, fever, infections).     Do not wear jewelry, make-up, hairpins, clips or nail polish. Do not wear lotions, powders, or perfumes. You may wear deodorant. Do not shave 48 hours prior to surgery. Men may shave face and neck. Do not bring valuables to the hospital.    [facility] is not responsible for any belongings or valuables.  Contacts, dentures or  bridgework may not be worn into surgery. Leave your suitcase in the car. After surgery it may be brought to your room. For patients admitted to the hospital, discharge time is determined by your treatment team.   Patients discharged the day of surgery will not be allowed to drive home.   Please read over the following fact sheets that you were given:    _x___ Take these medicines the morning of surgery with A SIP OF WATER:    1. levothyroxine (SYNTHROID, LEVOTHROID) 50 MCG tablet  2.   3.   4.  5.  6.  ____ Fleet Enema (as directed)   __x__ Use CHG Soap as directed  ____ Use  inhalers on the day of surgery  ____ Stop metformin 2 days prior to surgery    ____ Take 1/2 of usual insulin dose the night before surgery. No insulin the morning          of surgery.   ____ Stop Coumadin/Plavix/aspirin on   __x__ Stop Anti-inflammatories ibuprofen aleve or aspirin until after the surgery  May take tylenol   ____ Stop supplements until after surgery.    ____ Bring C-Pap to the hospital.

## 2018-12-07 ENCOUNTER — Other Ambulatory Visit: Payer: BLUE CROSS/BLUE SHIELD

## 2018-12-07 ENCOUNTER — Other Ambulatory Visit: Payer: Self-pay

## 2018-12-07 ENCOUNTER — Encounter
Admission: RE | Admit: 2018-12-07 | Discharge: 2018-12-07 | Disposition: A | Discharge status: Home / Self Care | Payer: BLUE CROSS/BLUE SHIELD | Source: Ambulatory Visit | Attending: Obstetrics and Gynecology | Admitting: Obstetrics and Gynecology

## 2018-12-07 ENCOUNTER — Inpatient Hospital Stay: Admission: RE | Admit: 2018-12-07 | Payer: BLUE CROSS/BLUE SHIELD | Source: Ambulatory Visit

## 2018-12-07 DIAGNOSIS — Z20828 Contact with and (suspected) exposure to other viral communicable diseases: Secondary | ICD-10-CM | POA: Insufficient documentation

## 2018-12-07 DIAGNOSIS — Z01812 Encounter for preprocedural laboratory examination: Secondary | ICD-10-CM | POA: Insufficient documentation

## 2018-12-07 DIAGNOSIS — I10 Essential (primary) hypertension: Secondary | ICD-10-CM | POA: Diagnosis not present

## 2018-12-07 DIAGNOSIS — Z01810 Encounter for preprocedural cardiovascular examination: Secondary | ICD-10-CM | POA: Insufficient documentation

## 2018-12-07 LAB — BASIC METABOLIC PANEL
Anion gap: 9 (ref 5–15)
BUN: 12 mg/dL (ref 6–20)
CO2: 27 mmol/L (ref 22–32)
Calcium: 9.1 mg/dL (ref 8.9–10.3)
Chloride: 102 mmol/L (ref 98–111)
Creatinine, Ser: 0.74 mg/dL (ref 0.44–1.00)
GFR calc Af Amer: >60 mL/min (ref >60)
GFR calc non Af Amer: >60 mL/min (ref >60)
Glucose, Bld: 94 mg/dL (ref 70–99)
Potassium: 3.8 mmol/L (ref 3.5–5.1)
Sodium: 138 mmol/L (ref 135–145)

## 2018-12-07 LAB — CBC
HCT: 39.5 % (ref 36.0–46.0)
Hemoglobin: 13.6 g/dL (ref 12.0–15.0)
MCH: 31.2 pg (ref 26.0–34.0)
MCHC: 34.4 g/dL (ref 30.0–36.0)
MCV: 90.6 fL (ref 80.0–100.0)
Platelets: 345 10*3/uL (ref 150–400)
RBC: 4.36 MIL/uL (ref 3.87–5.11)
RDW: 13.2 % (ref 11.5–15.5)
WBC: 7.6 10*3/uL (ref 4.0–10.5)
nRBC: 0 % (ref 0.0–0.2)

## 2018-12-07 LAB — TYPE AND SCREEN
ABO/RH(D): A POS
Antibody Screen: NEGATIVE

## 2018-12-07 LAB — SARS CORONAVIRUS 2 (TAT 6-24 HRS): SARS Coronavirus 2: NEGATIVE

## 2018-12-11 ENCOUNTER — Encounter: Payer: Self-pay | Admitting: *Deleted

## 2018-12-11 ENCOUNTER — Ambulatory Visit
Admission: RE | Admit: 2018-12-11 | Discharge: 2018-12-11 | Disposition: A | Discharge status: Home / Self Care | Payer: BLUE CROSS/BLUE SHIELD | Attending: Obstetrics and Gynecology | Admitting: Obstetrics and Gynecology

## 2018-12-11 ENCOUNTER — Ambulatory Visit: Payer: BLUE CROSS/BLUE SHIELD | Admitting: Anesthesiology

## 2018-12-11 ENCOUNTER — Other Ambulatory Visit: Payer: Self-pay

## 2018-12-11 ENCOUNTER — Encounter
Admission: RE | Disposition: A | Discharge status: Home / Self Care | Payer: Self-pay | Source: Home / Self Care | Attending: Obstetrics and Gynecology

## 2018-12-11 DIAGNOSIS — R1084 Generalized abdominal pain: Secondary | ICD-10-CM | POA: Diagnosis not present

## 2018-12-11 DIAGNOSIS — I10 Essential (primary) hypertension: Secondary | ICD-10-CM | POA: Diagnosis not present

## 2018-12-11 DIAGNOSIS — D060 Carcinoma in situ of endocervix: Secondary | ICD-10-CM | POA: Diagnosis not present

## 2018-12-11 DIAGNOSIS — C530 Malignant neoplasm of endocervix: Secondary | ICD-10-CM | POA: Diagnosis not present

## 2018-12-11 DIAGNOSIS — E039 Hypothyroidism, unspecified: Secondary | ICD-10-CM | POA: Diagnosis not present

## 2018-12-11 DIAGNOSIS — Z9049 Acquired absence of other specified parts of digestive tract: Secondary | ICD-10-CM | POA: Insufficient documentation

## 2018-12-11 DIAGNOSIS — Z880 Allergy status to penicillin: Secondary | ICD-10-CM | POA: Diagnosis not present

## 2018-12-11 DIAGNOSIS — Z79899 Other long term (current) drug therapy: Secondary | ICD-10-CM | POA: Diagnosis not present

## 2018-12-11 DIAGNOSIS — K219 Gastro-esophageal reflux disease without esophagitis: Secondary | ICD-10-CM | POA: Insufficient documentation

## 2018-12-11 DIAGNOSIS — N838 Other noninflammatory disorders of ovary, fallopian tube and broad ligament: Secondary | ICD-10-CM | POA: Diagnosis not present

## 2018-12-11 DIAGNOSIS — Z91010 Allergy to peanuts: Secondary | ICD-10-CM | POA: Diagnosis not present

## 2018-12-11 DIAGNOSIS — Z793 Long term (current) use of hormonal contraceptives: Secondary | ICD-10-CM | POA: Diagnosis not present

## 2018-12-11 DIAGNOSIS — Z833 Family history of diabetes mellitus: Secondary | ICD-10-CM | POA: Insufficient documentation

## 2018-12-11 DIAGNOSIS — D069 Carcinoma in situ of cervix, unspecified: Secondary | ICD-10-CM | POA: Diagnosis not present

## 2018-12-11 DIAGNOSIS — C539 Malignant neoplasm of cervix uteri, unspecified: Secondary | ICD-10-CM | POA: Diagnosis not present

## 2018-12-11 HISTORY — PX: BILATERAL SALPINGECTOMY: SHX5743

## 2018-12-11 HISTORY — PX: VAGINAL HYSTERECTOMY: SHX2639

## 2018-12-11 LAB — POCT PREGNANCY, URINE: Preg Test, Ur: NEGATIVE

## 2018-12-11 SURGERY — HYSTERECTOMY, VAGINAL
Anesthesia: General

## 2018-12-11 MED ORDER — FAMOTIDINE 20 MG PO TABS
20.0000 mg | ORAL_TABLET | Freq: Once | ORAL | Status: AC
  Administered 2018-12-11: 20 mg via ORAL

## 2018-12-11 MED ORDER — ACETAMINOPHEN 500 MG PO TABS
1000.0000 mg | ORAL_TABLET | ORAL | Status: AC
  Administered 2018-12-11: 1000 mg via ORAL

## 2018-12-11 MED ORDER — PHENYLEPHRINE HCL (PRESSORS) 10 MG/ML IV SOLN
INTRAVENOUS | Status: AC
  Filled 2018-12-11: qty 1

## 2018-12-11 MED ORDER — GLYCOPYRROLATE 0.2 MG/ML IJ SOLN
INTRAMUSCULAR | Status: AC
  Filled 2018-12-11: qty 1

## 2018-12-11 MED ORDER — FENTANYL CITRATE (PF) 100 MCG/2ML IJ SOLN
INTRAMUSCULAR | Status: AC
  Administered 2018-12-11: 25 ug via INTRAVENOUS
  Filled 2018-12-11: qty 2

## 2018-12-11 MED ORDER — OXYCODONE HCL 5 MG PO TABS
5.0000 mg | ORAL_TABLET | Freq: Once | ORAL | Status: AC | PRN
  Administered 2018-12-11: 5 mg via ORAL

## 2018-12-11 MED ORDER — ROCURONIUM BROMIDE 50 MG/5ML IV SOLN
INTRAVENOUS | Status: AC
  Filled 2018-12-11: qty 1

## 2018-12-11 MED ORDER — OXYCODONE HCL 5 MG PO TABS
ORAL_TABLET | ORAL | Status: AC
  Filled 2018-12-11: qty 1

## 2018-12-11 MED ORDER — KETOROLAC TROMETHAMINE 30 MG/ML IJ SOLN
INTRAMUSCULAR | Status: AC
  Filled 2018-12-11: qty 1

## 2018-12-11 MED ORDER — CELECOXIB 200 MG PO CAPS
400.0000 mg | ORAL_CAPSULE | ORAL | Status: AC
  Administered 2018-12-11: 400 mg via ORAL

## 2018-12-11 MED ORDER — DEXAMETHASONE SODIUM PHOSPHATE 10 MG/ML IJ SOLN
INTRAMUSCULAR | Status: AC
  Filled 2018-12-11: qty 1

## 2018-12-11 MED ORDER — OXYCODONE HCL 5 MG/5ML PO SOLN: 5.0000 mg | Freq: Once | ORAL | Status: AC | PRN

## 2018-12-11 MED ORDER — FAMOTIDINE 20 MG PO TABS
ORAL_TABLET | ORAL | Status: AC
  Filled 2018-12-11: qty 1

## 2018-12-11 MED ORDER — LIDOCAINE-EPINEPHRINE 1 %-1:100000 IJ SOLN
INTRAMUSCULAR | Status: AC
  Filled 2018-12-11: qty 1

## 2018-12-11 MED ORDER — LIDOCAINE HCL (CARDIAC) PF 100 MG/5ML IV SOSY
PREFILLED_SYRINGE | INTRAVENOUS | Status: DC | PRN
  Administered 2018-12-11: 100 mg via INTRAVENOUS

## 2018-12-11 MED ORDER — ACETAMINOPHEN 500 MG PO TABS
ORAL_TABLET | ORAL | Status: AC
  Filled 2018-12-11: qty 2

## 2018-12-11 MED ORDER — SUCCINYLCHOLINE CHLORIDE 20 MG/ML IJ SOLN
INTRAMUSCULAR | Status: AC
  Filled 2018-12-11: qty 1

## 2018-12-11 MED ORDER — LIDOCAINE-EPINEPHRINE 1 %-1:100000 IJ SOLN
INTRAMUSCULAR | Status: DC | PRN
  Administered 2018-12-11: 10 mL

## 2018-12-11 MED ORDER — SUGAMMADEX SODIUM 200 MG/2ML IV SOLN
INTRAVENOUS | Status: AC
  Filled 2018-12-11: qty 2

## 2018-12-11 MED ORDER — ONDANSETRON HCL 4 MG/2ML IJ SOLN
INTRAMUSCULAR | Status: AC
  Filled 2018-12-11: qty 2

## 2018-12-11 MED ORDER — CEFAZOLIN SODIUM-DEXTROSE 2-4 GM/100ML-% IV SOLN
INTRAVENOUS | Status: AC
  Filled 2018-12-11: qty 100

## 2018-12-11 MED ORDER — PROPOFOL 10 MG/ML IV BOLUS
INTRAVENOUS | Status: AC
  Filled 2018-12-11: qty 20

## 2018-12-11 MED ORDER — FENTANYL CITRATE (PF) 100 MCG/2ML IJ SOLN
25.0000 ug | INTRAMUSCULAR | Status: DC | PRN
  Administered 2018-12-11 (×4): 25 ug via INTRAVENOUS

## 2018-12-11 MED ORDER — SUGAMMADEX SODIUM 500 MG/5ML IV SOLN
INTRAVENOUS | Status: DC | PRN
  Administered 2018-12-11: 400 mg via INTRAVENOUS
  Administered 2018-12-11: 200 mg via INTRAVENOUS

## 2018-12-11 MED ORDER — KETOROLAC TROMETHAMINE 30 MG/ML IJ SOLN
INTRAMUSCULAR | Status: DC | PRN
  Administered 2018-12-11: 30 mg via INTRAVENOUS

## 2018-12-11 MED ORDER — ROCURONIUM BROMIDE 100 MG/10ML IV SOLN
INTRAVENOUS | Status: DC | PRN
  Administered 2018-12-11: 50 mg via INTRAVENOUS
  Administered 2018-12-11: 20 mg via INTRAVENOUS

## 2018-12-11 MED ORDER — PROPOFOL 10 MG/ML IV BOLUS
INTRAVENOUS | Status: DC | PRN
  Administered 2018-12-11: 150 mg via INTRAVENOUS
  Administered 2018-12-11: 50 mg via INTRAVENOUS

## 2018-12-11 MED ORDER — MIDAZOLAM HCL 2 MG/2ML IJ SOLN
INTRAMUSCULAR | Status: AC
  Filled 2018-12-11: qty 2

## 2018-12-11 MED ORDER — EPHEDRINE SULFATE 50 MG/ML IJ SOLN
INTRAMUSCULAR | Status: AC
  Filled 2018-12-11: qty 1

## 2018-12-11 MED ORDER — CELECOXIB 200 MG PO CAPS
ORAL_CAPSULE | ORAL | Status: AC
  Filled 2018-12-11: qty 2

## 2018-12-11 MED ORDER — FENTANYL CITRATE (PF) 100 MCG/2ML IJ SOLN
INTRAMUSCULAR | Status: AC
  Filled 2018-12-11: qty 2

## 2018-12-11 MED ORDER — MIDAZOLAM HCL 2 MG/2ML IJ SOLN
INTRAMUSCULAR | Status: DC | PRN
  Administered 2018-12-11: 2 mg via INTRAVENOUS

## 2018-12-11 MED ORDER — CEFAZOLIN SODIUM-DEXTROSE 2-4 GM/100ML-% IV SOLN: 2.0000 g | Freq: Once | INTRAVENOUS | Status: DC

## 2018-12-11 MED ORDER — FENTANYL CITRATE (PF) 100 MCG/2ML IJ SOLN
INTRAMUSCULAR | Status: DC | PRN
  Administered 2018-12-11 (×2): 50 ug via INTRAVENOUS

## 2018-12-11 MED ORDER — LACTATED RINGERS IV SOLN
INTRAVENOUS | Status: DC
  Administered 2018-12-11: 08:00:00 via INTRAVENOUS

## 2018-12-11 MED ORDER — DEXAMETHASONE SODIUM PHOSPHATE 10 MG/ML IJ SOLN
INTRAMUSCULAR | Status: DC | PRN
  Administered 2018-12-11: 10 mg via INTRAVENOUS

## 2018-12-11 MED ORDER — LIDOCAINE HCL (PF) 2 % IJ SOLN
INTRAMUSCULAR | Status: AC
  Filled 2018-12-11: qty 10

## 2018-12-11 MED ORDER — ONDANSETRON HCL 4 MG/2ML IJ SOLN
INTRAMUSCULAR | Status: DC | PRN
  Administered 2018-12-11: 4 mg via INTRAVENOUS

## 2018-12-11 SURGICAL SUPPLY — 34 items
BAG URINE DRAIN 2000ML AR STRL (UROLOGICAL SUPPLIES) ×3 IMPLANT
CANISTER SUCT 1200ML W/VALVE (MISCELLANEOUS) ×3 IMPLANT
CATH FOLEY 2WAY  5CC 16FR (CATHETERS) ×1
CATH ROBINSON RED A/P 16FR (CATHETERS) ×3 IMPLANT
CATH URTH 16FR FL 2W BLN LF (CATHETERS) ×2 IMPLANT
COVER WAND RF STERILE (DRAPES) ×3 IMPLANT
DRAPE PERI LITHO V/GYN (MISCELLANEOUS) ×3 IMPLANT
DRAPE SURG 17X11 SM STRL (DRAPES) ×3 IMPLANT
DRAPE UNDER BUTTOCK W/FLU (DRAPES) ×3 IMPLANT
ELECT REM PT RETURN 9FT ADLT (ELECTROSURGICAL) ×3
ELECTRODE REM PT RTRN 9FT ADLT (ELECTROSURGICAL) ×2 IMPLANT
GLOVE BIO SURGEON STRL SZ8 (GLOVE) ×3 IMPLANT
GOWN STRL REUS W/ TWL LRG LVL3 (GOWN DISPOSABLE) ×6 IMPLANT
GOWN STRL REUS W/ TWL XL LVL3 (GOWN DISPOSABLE) ×2 IMPLANT
GOWN STRL REUS W/TWL LRG LVL3 (GOWN DISPOSABLE) ×3
GOWN STRL REUS W/TWL XL LVL3 (GOWN DISPOSABLE) ×1
KIT TURNOVER CYSTO (KITS) ×3 IMPLANT
LABEL OR SOLS (LABEL) ×3 IMPLANT
NEEDLE HYPO 22GX1.5 SAFETY (NEEDLE) ×3 IMPLANT
PACK BASIN MINOR ARMC (MISCELLANEOUS) ×3 IMPLANT
PAD OB MATERNITY 4.3X12.25 (PERSONAL CARE ITEMS) ×3 IMPLANT
PAD PREP 24X41 OB/GYN DISP (PERSONAL CARE ITEMS) ×3 IMPLANT
SOL PREP PVP 2OZ (MISCELLANEOUS) ×3
SOLUTION PREP PVP 2OZ (MISCELLANEOUS) ×2 IMPLANT
SURGILUBE 2OZ TUBE FLIPTOP (MISCELLANEOUS) ×3 IMPLANT
SUT PDS 2-0 27IN (SUTURE) ×3 IMPLANT
SUT VIC AB 0 CT1 27 (SUTURE) ×3
SUT VIC AB 0 CT1 27XCR 8 STRN (SUTURE) ×6 IMPLANT
SUT VIC AB 0 CT1 36 (SUTURE) ×6 IMPLANT
SUT VIC AB 2-0 SH 27 (SUTURE) ×3
SUT VIC AB 2-0 SH 27XBRD (SUTURE) ×6 IMPLANT
SYR 10ML LL (SYRINGE) ×3 IMPLANT
SYR CONTROL 10ML LL (SYRINGE) ×3 IMPLANT
WATER STERILE IRR 1000ML POUR (IV SOLUTION) ×3 IMPLANT

## 2018-12-11 NOTE — Transfer of Care (Signed)
Immediate Anesthesia Transfer of Care Note  Patient: Romilly Alexiou  Procedure(s) Performed: Procedure(s): HYSTERECTOMY VAGINAL (N/A) BILATERAL SALPINGECTOMY (Bilateral)  Patient Location: PACU and Endoscopy Unit  Anesthesia Type:General  Level of Consciousness: sedated  Airway & Oxygen Therapy: Patient Spontanous Breathing and Patient connected to nasal cannula oxygen  Post-op Assessment: Report given to RN and Post -op Vital signs reviewed and stable  Post vital signs: Reviewed and stable  Last Vitals:  Vitals:   12/11/18 0622 12/11/18 0854  BP: 124/85 102/68  Pulse: 71 63  Resp: 16 (!) 21  Temp: 36.6 C (!) 36.2 C  SpO2: [id number]    Complications: No apparent anesthesia complications

## 2018-12-11 NOTE — Anesthesia Preprocedure Evaluation (Signed)
Anesthesia Evaluation  Patient identified by MRN, date of birth, ID band Patient awake    Reviewed: Allergy & Precautions, H&P , NPO status , Patient's Chart, lab work & pertinent test results  History of Anesthesia Complications Negative for: history of anesthetic complications  Airway Mallampati: III  TM Distance: >3 FB Neck ROM: full    Dental  (+) Chipped   Pulmonary neg pulmonary ROS, neg shortness of breath,           Cardiovascular Exercise Tolerance: Good hypertension, (-) angina(-) Past MI and (-) DOE      Neuro/Psych negative neurological ROS  negative psych ROS   GI/Hepatic Neg liver ROS, GERD  ,  Endo/Other  Hypothyroidism   Renal/GU      Musculoskeletal   Abdominal   Peds  Hematology negative hematology ROS (+)   Anesthesia Other Findings Past Medical History: No date: GERD (gastroesophageal reflux disease) No date: Hypertension No date: Hypothyroidism No date: Thyroid disease  Past Surgical History: 12/03/2015: CHOLECYSTECTOMY; N/A     Comment:  Procedure: LAPAROSCOPIC CHOLECYSTECTOMY WITH               INTRAOPERATIVE CHOLANGIOGRAM;  Surgeon: Christene Lye, MD;  Location: [facility] ORS;  Service: General;                Laterality: N/A; No date: TONSILLECTOMY No date: WISDOM TOOTH EXTRACTION  BMI    Body Mass Index: 36.32 kg/m      Reproductive/Obstetrics negative OB ROS                             Anesthesia Physical Anesthesia Plan  ASA: III  Anesthesia Plan: General ETT   Post-op Pain Management:    Induction: Intravenous  PONV Risk Score and Plan: Ondansetron, Dexamethasone, Midazolam and Treatment may vary due to age or medical condition  Airway Management Planned: Oral ETT  Additional Equipment:   Intra-op Plan:   Post-operative Plan: Extubation in OR  Informed Consent: I have reviewed the patients History and Physical,  chart, labs and discussed the procedure including the risks, benefits and alternatives for the proposed anesthesia with the patient or authorized representative who has indicated his/her understanding and acceptance.     Dental Advisory Given  Plan Discussed with: Anesthesiologist, CRNA and Surgeon  Anesthesia Plan Comments: (Patient consented for risks of anesthesia including but not limited to:  - adverse reactions to medications - damage to teeth, lips or other oral mucosa - sore throat or hoarseness - Damage to heart, brain, lungs or loss of life  Patient voiced understanding.)        Anesthesia Quick Evaluation

## 2018-12-11 NOTE — Anesthesia Post-op Follow-up Note (Signed)
Anesthesia QCDR form completed.        

## 2018-12-11 NOTE — Op Note (Signed)
NAMEMAHELET, BLOMGREN MEDICAL RECORD [id number] ACCOUNT [id number] DATE OF BIRTH:April 22, 1990 FACILITY: [facility] LOCATION: [facility] PHYSICIAN:Rayfield Beem Josefine Class, MD  OPERATIVE REPORT  DATE OF PROCEDURE:  12/11/2018  PREOPERATIVE DIAGNOSIS:  Adenocarcinoma in situ of the cervix.  POSTOPERATIVE DIAGNOSIS:  Adenocarcinoma in situ of the cervix.  PROCEDURE: 1.  Total vaginal hysterectomy. 2.  Bilateral salpingectomy.  SURGEON:  Boykin Nearing, MD  FIRST ASSISTANT:  Benjaman Kindler, MD   ANESTHESIA:  General endotracheal anesthesia.  INDICATIONS:  A 28 year old gravida 3, para 2 patient underwent cervical biopsies that showed adenocarcinoma in situ of the cervix.  The patient has elected for definitive surgical intervention.  DESCRIPTION OF PROCEDURE:  After adequate general endotracheal anesthesia, the patient was placed in dorsal supine position, legs in the candy cane stirrups.  The patient's lower abdomen, perineum and vagina were prepped and draped in normal sterile  fashion.  The patient did receive 2 grams of IV Ancef prior to commencement for surgical prophylaxis.  Timeout was performed.  Straight catheterization of the bladder yielded 250 mL of clear urine.  Weighted speculum was placed in the posterior vaginal  vault and the anterior cervix and posterior cervix were grasped with thyroid tenacula.  Cervix was then circumferentially injected with 1% lidocaine with epinephrine.  A direct posterior colpotomy incision was made.  Upon entry into the posterior  cul-de-sac the long billed weighted speculum was placed.  The uterosacral ligaments were bilaterally clamped, transected, suture ligated with 0 Vicryl suture.  Anterior cervix was circumferentially incised and the anterior cul-de-sac was entered sharply.   The Deaver retractor was placed within to elevate the bladder anteriorly.  Cardinal ligaments were bilaterally clamped, transected, suture ligated with 0 Vicryl  suture.  Uterine arteries were then bilaterally clamped, transected, suture ligated with 0  Vicryl suture.  The cornua were bilaterally, double ligated with 0 Vicryl suture.  Cervix and uterus was delivered.  Each fallopian tube was grasped and clamped with excision of the fimbriated end of each fallopian tube.  Each pedicle was ligated with 0  Vicryl suture.  Good hemostasis was noted.  A pursestring suture of 2-0 PDS was used to close the peritoneum.  The vaginal cuff was then closed with a running 0 Vicryl suture.  Good hemostasis, good approximation of tissues.  COMPLICATIONS:  There were no complications.  ESTIMATED BLOOD LOSS:  25 mL.  INTRAOPERATIVE FLUIDS:  400 mL.  URINE OUTPUT:  250 mL at commencement and additional 50 mL at the end of the case.  DISPOSITION:  The patient was taken to recovery room in good condition.  CN/NUANCE  D:12/11/2018 T:12/11/2018 JOB:[id number]

## 2018-12-11 NOTE — Anesthesia Postprocedure Evaluation (Signed)
Anesthesia Post Note  Patient: Miranda Briggs  Procedure(s) Performed: HYSTERECTOMY VAGINAL (N/A ) BILATERAL SALPINGECTOMY (Bilateral )  Patient location during evaluation: PACU Anesthesia Type: General Level of consciousness: awake and alert Pain management: pain level controlled Vital Signs Assessment: post-procedure vital signs reviewed and stable Respiratory status: spontaneous breathing, nonlabored ventilation, respiratory function stable and patient connected to nasal cannula oxygen Cardiovascular status: blood pressure returned to baseline and stable Postop Assessment: no apparent nausea or vomiting Anesthetic complications: no     Last Vitals:  Vitals:   12/11/18 0942 12/11/18 0952  BP: 116/79 115/79  Pulse: 83 86  Resp: 16 16  Temp:  36.4 C  SpO2: 94% 95%    Last Pain:  Vitals:   12/11/18 1007  TempSrc:   PainSc: 4                  Afreen Siebels K Ariell Gunnels

## 2018-12-11 NOTE — Anesthesia Procedure Notes (Signed)
Procedure Name: Intubation Date/Time: 12/11/2018 7:36 AM Performed by: Doreen Salvage, CRNA Pre-anesthesia Checklist: Patient identified, Patient being monitored, Timeout performed, Emergency Drugs available and Suction available Patient Re-evaluated:Patient Re-evaluated prior to induction Oxygen Delivery Method: Circle system utilized Preoxygenation: Pre-oxygenation with 100% oxygen Induction Type: IV induction Ventilation: Mask ventilation without difficulty Laryngoscope Size: Mac and 3 Grade View: Grade I Tube type: Oral Tube size: 7.0 mm Number of attempts: 1 Airway Equipment and Method: Stylet Placement Confirmation: ETT inserted through vocal cords under direct vision,  positive ETCO2 and breath sounds checked- equal and bilateral Secured at: 22 cm Tube secured with: Tape Dental Injury: Teeth and Oropharynx as per pre-operative assessment

## 2018-12-11 NOTE — Progress Notes (Signed)
Pt here for [facility] and bilateral salpingectomy for adenocarcinoma in situ Labs reviewed  All questions answered  Plan on d/c to home post op

## 2018-12-11 NOTE — Brief Op Note (Signed)
12/11/2018  8:37 AM  PATIENT:  Miranda Briggs  28 y.o. female  PRE-OPERATIVE DIAGNOSIS:  adenocarcinoma in situ of cervix  POST-OPERATIVE DIAGNOSIS:  adenocarcinoma in situ of cervix  PROCEDURE:  Procedure(s): HYSTERECTOMY VAGINAL (N/A) BILATERAL SALPINGECTOMY (Bilateral)  SURGEON:  Surgeon(s) and Role:    * Ragena Fiola, Gwen Her, MD - Primary    * Benjaman Kindler, MD - Assisting  PHYSICIAN ASSISTANT: CST  ASSISTANTS: none   ANESTHESIA:   general  EBL:  25 mL   BLOOD ADMINISTERED:none  DRAINS: none   LOCAL MEDICATIONS USED:  LIDOCAINE   SPECIMEN:  Source of Specimen:  cervix , uterus and fallopian tubes , bilateral   DISPOSITION OF SPECIMEN:  PATHOLOGY  COUNTS:  YES  TOURNIQUET:  * No tourniquets in log *  DICTATION: .Other Dictation: Dictation Number verbal  PLAN OF CARE: Discharge to home after PACU  PATIENT DISPOSITION:  PACU - hemodynamically stable.   Delay start of Pharmacological VTE agent (>24hrs) due to surgical blood loss or risk of bleeding: not applicable

## 2018-12-11 NOTE — Discharge Instructions (Addendum)
Vaginal Hysterectomy, Care After Refer to this sheet in the next few weeks. These instructions provide you with information about caring for yourself after your procedure. Your health care provider may also give you more specific instructions. Your treatment has been planned according to current medical practices, but problems sometimes occur. Call your health care provider if you have any problems or questions after your procedure. What can I expect after the procedure? After the procedure, it is common to have:  Pain.  Soreness and numbness in your incision areas.  Vaginal bleeding and discharge.  Constipation.  Temporary problems emptying the bladder.  Feelings of sadness or other emotions. Follow these instructions at home: Medicines  Take over-the-counter and prescription medicines only as told by your health care provider.  If you were prescribed an antibiotic medicine, take it as told by your health care provider. Do not stop taking the antibiotic even if you start to feel better.  Do not drive or operate heavy machinery while taking prescription pain medicine. Activity  Return to your normal activities as told by your health care provider. Ask your health care provider what activities are safe for you.  Get regular exercise as told by your health care provider. You may be told to take short walks every day and go farther each time.  Do not lift anything that is heavier than 10 lb (4.5 kg). General instructions   Do not put anything in your vagina for 6 weeks after your surgery or as told by your health care provider. This includes tampons and douches.  Do not have sex until your health care provider says you can.  Do not take baths, swim, or use a hot tub until your health care provider approves.  Drink enough fluid to keep your urine clear or pale yellow.  Do not drive for 24 hours if you were given a sedative.  Keep all follow-up visits as told by your health  care provider. This is important. Contact a health care provider if:  Your pain medicine is not helping.  You have a fever.  You have redness, swelling, or pain at your incision site.  You have blood, pus, or a bad-smelling discharge from your vagina.  You continue to have difficulty urinating. Get help right away if:  You have severe abdominal or back pain.  You have heavy bleeding from your vagina.  You have chest pain or shortness of breath. This information is not intended to replace advice given to you by your health care provider. Make sure you discuss any questions you have with your health care provider. Document Released: 04/14/2015 Document Revised: 08/14/2015 Document Reviewed: 01/05/2015 Elsevier Patient Education  2020 [place]   1) The drugs that you were given will stay in your system until tomorrow so for the next 24 hours you should not:  A) Drive an automobile B) Make any legal decisions C) Drink any alcoholic beverage   2) You may resume regular meals tomorrow.  Today it is better to start with liquids and gradually work up to solid foods.  You may eat anything you prefer, but it is better to start with liquids, then soup and crackers, and gradually work up to solid foods.   3) Please notify your doctor immediately if you have any unusual bleeding, trouble breathing, redness and pain at the surgery site, drainage, fever, or pain not relieved by medication.    4) Additional Instructions:  Additional Instructions: ° ° ° ° ° ° ° °Please contact your physician with any problems or Same Day Surgery at [phone], Monday through Friday 6 am to 4 pm, or [place] [facility] [place] Main number at [phone]. ° ° ° ° °

## 2018-12-12 ENCOUNTER — Encounter: Payer: Self-pay | Admitting: Obstetrics and Gynecology

## 2018-12-13 LAB — SURGICAL PATHOLOGY

## 2019-01-08 ENCOUNTER — Telehealth: Payer: Self-pay

## 2019-01-08 NOTE — Telephone Encounter (Signed)
Confirmed appointment with patient. klh °

## 2019-01-10 ENCOUNTER — Encounter: Payer: Self-pay | Admitting: Adult Health

## 2019-01-10 ENCOUNTER — Ambulatory Visit: Payer: BLUE CROSS/BLUE SHIELD | Admitting: Adult Health

## 2019-01-10 ENCOUNTER — Other Ambulatory Visit: Payer: Self-pay

## 2019-01-10 VITALS — BP 140/100 | HR 97 | Resp 16 | Ht 63.0 in | Wt 212.0 lb

## 2019-01-10 DIAGNOSIS — I10 Essential (primary) hypertension: Secondary | ICD-10-CM

## 2019-01-10 DIAGNOSIS — E039 Hypothyroidism, unspecified: Secondary | ICD-10-CM | POA: Diagnosis not present

## 2019-01-10 MED ORDER — BISOPROLOL-HYDROCHLOROTHIAZIDE 5-6.25 MG PO TABS: 1.0000 | ORAL_TABLET | Freq: Every day | ORAL | 0 refills | Status: DC

## 2019-01-10 NOTE — Progress Notes (Signed)
[facility] [place], [place] [address]  Internal MEDICINE  Office Visit Note  Patient Name: Miranda Briggs  [id number]  [id number]  Date of Service: 01/10/2019  Chief Complaint  Patient presents with  . Hypertension    HPI Pt is here for follow up on HTN.  Miranda Briggs reports Miranda Briggs had noticed her bp was going down initially, however as the holidays came on her Miranda Briggs stopped checking her pressure regularly.  Miranda Briggs continues to Denies Chest pain, Shortness of breath, palpitations, headache, or blurred vision. Miranda Briggs is taking her medication regularly.     Current Medication: Outpatient Encounter Medications as of 01/10/2019  Medication Sig  . acetaminophen (TYLENOL) 500 MG tablet Take 1,000 mg by mouth every 6 (six) hours as needed for moderate pain or headache.  . levothyroxine (SYNTHROID, LEVOTHROID) 50 MCG tablet Take 50 mcg by mouth daily.   . Multiple Vitamin (MULTIVITAMIN WITH MINERALS) TABS tablet Take 1 tablet by mouth every evening.  . [DISCONTINUED] bisoprolol-hydrochlorothiazide (ZIAC) 2.5-6.25 MG tablet Take 1 tablet by mouth daily. (Patient taking differently: Take 1 tablet by mouth every evening. )  . bisoprolol-hydrochlorothiazide (ZIAC) 5-6.25 MG tablet Take 1 tablet by mouth daily.   No facility-administered encounter medications on file as of 01/10/2019.    Surgical History: Past Surgical History:  Procedure Laterality Date  . BILATERAL SALPINGECTOMY Bilateral 12/11/2018   Procedure: BILATERAL SALPINGECTOMY;  Surgeon: Schermerhorn, Gwen Her, MD;  Location: [facility] ORS;  Service: Gynecology;  Laterality: Bilateral;  . CHOLECYSTECTOMY N/A 12/03/2015   Procedure: LAPAROSCOPIC CHOLECYSTECTOMY WITH INTRAOPERATIVE CHOLANGIOGRAM;  Surgeon: Christene Lye, MD;  Location: [facility] ORS;  Service: General;  Laterality: N/A;  . TONSILLECTOMY    . VAGINAL HYSTERECTOMY N/A 12/11/2018   Procedure: HYSTERECTOMY VAGINAL;  Surgeon: Schermerhorn, Gwen Her, MD;  Location: [facility]  ORS;  Service: Gynecology;  Laterality: N/A;  . WISDOM TOOTH EXTRACTION      Medical History: Past Medical History:  Diagnosis Date  . GERD (gastroesophageal reflux disease)   . Hypertension   . Hypothyroidism   . Thyroid disease     Family History: Family History  Problem Relation Age of Onset  . Diabetes Father     Social History   Socioeconomic History  . Marital status: Married    Spouse name: Not on file  . Number of children: Not on file  . Years of education: Not on file  . Highest education level: Not on file  Occupational History  . Not on file  Tobacco Use  . Smoking status: Never Smoker  . Smokeless tobacco: Never Used  Substance and Sexual Activity  . Alcohol use: Yes    Comment: OCC WINE  . Drug use: No  . Sexual activity: Not on file  Other Topics Concern  . Not on file  Social History Narrative  . Not on file   Social Determinants of Health   Financial Resource Strain:   . Difficulty of Paying Living Expenses: Not on file  Food Insecurity:   . Worried About Charity fundraiser in the Last Year: Not on file  . Ran Out of Food in the Last Year: Not on file  Transportation Needs:   . Lack of Transportation (Medical): Not on file  . Lack of Transportation (Non-Medical): Not on file  Physical Activity:   . Days of Exercise per Week: Not on file  . Minutes of Exercise per Session: Not on file  Stress:   . Feeling of Stress : Not on  file  Social Connections:   . Frequency of Communication with Friends and Family: Not on file  . Frequency of Social Gatherings with Friends and Family: Not on file  . Attends Religious Services: Not on file  . Active Member of Clubs or Organizations: Not on file  . Attends Archivist Meetings: Not on file  . Marital Status: Not on file  Intimate Partner Violence:   . Fear of Current or Ex-Partner: Not on file  . Emotionally Abused: Not on file  . Physically Abused: Not on file  . Sexually Abused: Not  on file      Review of Systems  Constitutional: Negative for chills, fatigue and unexpected weight change.  HENT: Negative for congestion, rhinorrhea, sneezing and sore throat.   Eyes: Negative for photophobia, pain and redness.  Respiratory: Negative for cough, chest tightness and shortness of breath.   Cardiovascular: Negative for chest pain and palpitations.  Gastrointestinal: Negative for abdominal pain, constipation, diarrhea, nausea and vomiting.  Endocrine: Negative.   Genitourinary: Negative for dysuria and frequency.  Musculoskeletal: Negative for arthralgias, back pain, joint swelling and neck pain.  Skin: Negative for rash.  Allergic/Immunologic: Negative.   Neurological: Negative for tremors and numbness.  Hematological: Negative for adenopathy. Does not bruise/bleed easily.  Psychiatric/Behavioral: Negative for behavioral problems and sleep disturbance. The patient is not nervous/anxious.     Vital Signs: BP (!) 140/100   Pulse 97   Resp 16   Ht 5\' 3"  (1.6 m)   Wt 212 lb (96.2 kg)   SpO2 97%   BMI 37.55 kg/m    Physical Exam Vitals and nursing note reviewed.  Constitutional:      General: Miranda Briggs is not in acute distress.    Appearance: Miranda Briggs is well-developed. Miranda Briggs is not diaphoretic.  HENT:     Head: Normocephalic and atraumatic.     Mouth/Throat:     Pharynx: No oropharyngeal exudate.  Eyes:     Pupils: Pupils are equal, round, and reactive to light.  Neck:     Thyroid: No thyromegaly.     Vascular: No JVD.     Trachea: No tracheal deviation.  Cardiovascular:     Rate and Rhythm: Normal rate and regular rhythm.     Heart sounds: Normal heart sounds. No murmur. No friction rub. No gallop.   Pulmonary:     Effort: Pulmonary effort is normal. No respiratory distress.     Breath sounds: Normal breath sounds. No wheezing or rales.  Chest:     Chest wall: No tenderness.  Abdominal:     Palpations: Abdomen is soft.     Tenderness: There is no abdominal  tenderness. There is no guarding.  Musculoskeletal:        General: Normal range of motion.     Cervical back: Normal range of motion and neck supple.  Lymphadenopathy:     Cervical: No cervical adenopathy.  Skin:    General: Skin is warm and dry.  Neurological:     Mental Status: Miranda Briggs is alert and oriented to person, place, and time.     Cranial Nerves: No cranial nerve deficit.  Psychiatric:        Behavior: Behavior normal.        Thought Content: Thought content normal.        Judgment: Judgment normal.    Assessment/Plan: 1. Essential hypertension, benign Increase dose of Ziac, follow up in 3 weeks.   2. Hypothyroidism, unspecified type Continue to monitor.  General Counseling: Miranda Briggs verbalizes understanding of the findings of todays visit and agrees with plan of treatment. I have discussed any further diagnostic evaluation that may be needed or ordered today. We also reviewed her medications today. Miranda Briggs has been encouraged to call the office with any questions or concerns that should arise related to todays visit.  Hypertension Counseling:   The following hypertensive lifestyle modification were recommended and discussed:  1. Limiting alcohol intake to less than 1 oz/day of ethanol:(24 oz of beer or 8 oz of wine or 2 oz of 100-proof whiskey). 2. Take baby ASA 81 mg daily. 3. Importance of regular aerobic exercise and losing weight. 4. Reduce dietary saturated fat and cholesterol intake for overall cardiovascular health. 5. Maintaining adequate dietary potassium, calcium, and magnesium intake. 6. Regular monitoring of the blood pressure. 7. Reduce sodium intake to less than 100 mmol/day (less than 2.3 gm of sodium or less than 6 gm of sodium choride)   No orders of the defined types were placed in this encounter.   Meds ordered this encounter  Medications  . bisoprolol-hydrochlorothiazide (ZIAC) 5-6.25 MG tablet    Sig: Take 1 tablet by mouth daily.    Dispense:  30  tablet    Refill:  0    Time spent: 25 Minutes   This patient was seen by Orson Gear AGNP-C in Collaboration with Dr Lavera Guise as a part of collaborative care agreement     Kendell Bane AGNP-C Internal medicine

## 2019-01-17 DIAGNOSIS — E039 Hypothyroidism, unspecified: Secondary | ICD-10-CM | POA: Diagnosis not present

## 2019-01-22 DIAGNOSIS — E669 Obesity, unspecified: Secondary | ICD-10-CM | POA: Diagnosis not present

## 2019-01-22 DIAGNOSIS — E039 Hypothyroidism, unspecified: Secondary | ICD-10-CM | POA: Diagnosis not present

## 2019-01-29 ENCOUNTER — Telehealth: Payer: Self-pay

## 2019-01-29 NOTE — Telephone Encounter (Signed)
Confirmed virtual visit with patient. klh 

## 2019-01-31 ENCOUNTER — Other Ambulatory Visit: Payer: Self-pay

## 2019-01-31 ENCOUNTER — Encounter: Payer: Self-pay | Admitting: Adult Health

## 2019-01-31 ENCOUNTER — Ambulatory Visit: Payer: BLUE CROSS/BLUE SHIELD | Admitting: Adult Health

## 2019-01-31 VITALS — BP 113/84 | Ht 63.0 in

## 2019-01-31 DIAGNOSIS — I10 Essential (primary) hypertension: Secondary | ICD-10-CM

## 2019-01-31 DIAGNOSIS — E039 Hypothyroidism, unspecified: Secondary | ICD-10-CM | POA: Diagnosis not present

## 2019-01-31 MED ORDER — BISOPROLOL-HYDROCHLOROTHIAZIDE 5-6.25 MG PO TABS: 1.0000 | ORAL_TABLET | Freq: Every day | ORAL | 1 refills | Status: DC

## 2019-01-31 NOTE — Progress Notes (Signed)
[facility] [place], [place] [address]  Internal MEDICINE  Telephone Visit  Patient Name: Miranda Briggs  [id number]  [id number]  Date of Service: 01/31/2019  I connected with the patient at 915 by telephone and verified the patients identity using two identifiers.   I discussed the limitations, risks, security and privacy concerns of performing an evaluation and management service by telephone and the availability of in person appointments. I also discussed with the patient that there may be a patient responsible charge related to the service.  The patient expressed understanding and agrees to proceed.    Chief Complaint  Patient presents with  . Telephone Assessment  . Telephone Screen  . Hypertension    HPI  Pt is here for follow up on HTN.  At our last visit we increased her dose of ziac.  This appears to be helping her bp.  She Denies Chest pain, Shortness of breath, palpitations, headache, or blurred vision.  She has not had any elevated bp's since increasing the dose.     Current Medication: Outpatient Encounter Medications as of 01/31/2019  Medication Sig  . acetaminophen (TYLENOL) 500 MG tablet Take 1,000 mg by mouth every 6 (six) hours as needed for moderate pain or headache.  . bisoprolol-hydrochlorothiazide (ZIAC) 5-6.25 MG tablet Take 1 tablet by mouth daily.  . Multiple Vitamin (MULTIVITAMIN WITH MINERALS) TABS tablet Take 1 tablet by mouth every evening.  . [DISCONTINUED] bisoprolol-hydrochlorothiazide (ZIAC) 5-6.25 MG tablet Take 1 tablet by mouth daily.  Marland Kitchen levothyroxine (SYNTHROID, LEVOTHROID) 50 MCG tablet Take 50 mcg by mouth daily.    No facility-administered encounter medications on file as of 01/31/2019.    Surgical History: Past Surgical History:  Procedure Laterality Date  . BILATERAL SALPINGECTOMY Bilateral 12/11/2018   Procedure: BILATERAL SALPINGECTOMY;  Surgeon: Schermerhorn, Gwen Her, MD;  Location: [facility] ORS;  Service:  Gynecology;  Laterality: Bilateral;  . CHOLECYSTECTOMY N/A 12/03/2015   Procedure: LAPAROSCOPIC CHOLECYSTECTOMY WITH INTRAOPERATIVE CHOLANGIOGRAM;  Surgeon: Christene Lye, MD;  Location: [facility] ORS;  Service: General;  Laterality: N/A;  . TONSILLECTOMY    . VAGINAL HYSTERECTOMY N/A 12/11/2018   Procedure: HYSTERECTOMY VAGINAL;  Surgeon: Schermerhorn, Gwen Her, MD;  Location: [facility] ORS;  Service: Gynecology;  Laterality: N/A;  . WISDOM TOOTH EXTRACTION      Medical History: Past Medical History:  Diagnosis Date  . GERD (gastroesophageal reflux disease)   . Hypertension   . Hypothyroidism   . Thyroid disease     Family History: Family History  Problem Relation Age of Onset  . Diabetes Father     Social History   Socioeconomic History  . Marital status: Married    Spouse name: Not on file  . Number of children: Not on file  . Years of education: Not on file  . Highest education level: Not on file  Occupational History  . Not on file  Tobacco Use  . Smoking status: Never Smoker  . Smokeless tobacco: Never Used  Substance and Sexual Activity  . Alcohol use: Yes    Comment: OCC WINE  . Drug use: No  . Sexual activity: Not on file  Other Topics Concern  . Not on file  Social History Narrative  . Not on file   Social Determinants of Health   Financial Resource Strain:   . Difficulty of Paying Living Expenses: Not on file  Food Insecurity:   . Worried About Charity fundraiser in the Last Year: Not on file  .  Ran Out of Food in the Last Year: Not on file  Transportation Needs:   . Lack of Transportation (Medical): Not on file  . Lack of Transportation (Non-Medical): Not on file  Physical Activity:   . Days of Exercise per Week: Not on file  . Minutes of Exercise per Session: Not on file  Stress:   . Feeling of Stress : Not on file  Social Connections:   . Frequency of Communication with Friends and Family: Not on file  . Frequency of Social Gatherings with  Friends and Family: Not on file  . Attends Religious Services: Not on file  . Active Member of Clubs or Organizations: Not on file  . Attends Archivist Meetings: Not on file  . Marital Status: Not on file  Intimate Partner Violence:   . Fear of Current or Ex-Partner: Not on file  . Emotionally Abused: Not on file  . Physically Abused: Not on file  . Sexually Abused: Not on file      Review of Systems  Constitutional: Negative for chills, fatigue and unexpected weight change.  HENT: Negative for congestion, rhinorrhea, sneezing and sore throat.   Eyes: Negative for photophobia, pain and redness.  Respiratory: Negative for cough, chest tightness and shortness of breath.   Cardiovascular: Negative for chest pain and palpitations.  Gastrointestinal: Negative for abdominal pain, constipation, diarrhea, nausea and vomiting.  Endocrine: Negative.   Genitourinary: Negative for dysuria and frequency.  Musculoskeletal: Negative for arthralgias, back pain, joint swelling and neck pain.  Skin: Negative for rash.  Allergic/Immunologic: Negative.   Neurological: Negative for tremors and numbness.  Hematological: Negative for adenopathy. Does not bruise/bleed easily.  Psychiatric/Behavioral: Negative for behavioral problems and sleep disturbance. The patient is not nervous/anxious.     Vital Signs: BP 113/84   Ht 5\' 3"  (1.6 m)   BMI 37.55 kg/m    Observation/Objective:  Well appearing, NAD noted.    Assessment/Plan: 1. Essential hypertension, benign Continue Ziac at new dose.  Follow up in 8 weeks.  Instructed to return sooner if new symptoms develop, or HTN returns.  - bisoprolol-hydrochlorothiazide (ZIAC) 5-6.25 MG tablet; Take 1 tablet by mouth daily.  Dispense: 90 tablet; Refill: 1  2. Hypothyroidism, unspecified type Stable, continue present management.  General Counseling: Sahian verbalizes understanding of the findings of today's phone visit and agrees with plan  of treatment. I have discussed any further diagnostic evaluation that may be needed or ordered today. We also reviewed her medications today. she has been encouraged to call the office with any questions or concerns that should arise related to todays visit.    No orders of the defined types were placed in this encounter.   Meds ordered this encounter  Medications  . bisoprolol-hydrochlorothiazide (ZIAC) 5-6.25 MG tablet    Sig: Take 1 tablet by mouth daily.    Dispense:  90 tablet    Refill:  1    Time spent: [place] AGNP-C Internal medicine

## 2019-02-02 ENCOUNTER — Other Ambulatory Visit: Payer: Self-pay | Admitting: Adult Health

## 2019-02-02 DIAGNOSIS — I10 Essential (primary) hypertension: Secondary | ICD-10-CM

## 2019-02-06 ENCOUNTER — Other Ambulatory Visit: Payer: Self-pay | Admitting: Internal Medicine

## 2019-02-06 DIAGNOSIS — I10 Essential (primary) hypertension: Secondary | ICD-10-CM

## 2019-03-26 ENCOUNTER — Telehealth: Payer: Self-pay

## 2019-03-26 NOTE — Telephone Encounter (Signed)
Confirmed appointment on 03/28/2019 and screened for covid. klh °

## 2019-03-28 ENCOUNTER — Ambulatory Visit: Payer: BLUE CROSS/BLUE SHIELD | Admitting: Adult Health

## 2019-03-28 ENCOUNTER — Other Ambulatory Visit: Payer: Self-pay

## 2019-03-28 ENCOUNTER — Encounter: Payer: Self-pay | Admitting: Adult Health

## 2019-03-28 VITALS — BP 128/88 | HR 75 | Temp 97.5°F | Resp 16 | Ht 63.0 in | Wt 197.4 lb

## 2019-03-28 DIAGNOSIS — E039 Hypothyroidism, unspecified: Secondary | ICD-10-CM | POA: Diagnosis not present

## 2019-03-28 DIAGNOSIS — I10 Essential (primary) hypertension: Secondary | ICD-10-CM | POA: Diagnosis not present

## 2019-03-28 NOTE — Progress Notes (Signed)
[facility] [place], [place] [address]  Internal MEDICINE  Office Visit Note  Patient Name: Miranda Briggs  [id number]  [id number]  Date of Service: 03/28/2019  Chief Complaint  Patient presents with  . Follow-up  . Gastroesophageal Reflux  . Hypertension    HPI  Pt is here for follow up on bp. She reports her blood pressure has been wnl at home when checking.  She denies Chest pain, Shortness of breath, palpitations, headache, or blurred vision.  Today her bp is slightly elevated.  She reports she has not taken her bp meds this morning, and she has had a stressful week getting her sister in laws wedding planned and executed.  She had a hysterectomy 4 months ago, she denies any issues since procedure.     Current Medication: Outpatient Encounter Medications as of 03/28/2019  Medication Sig  . acetaminophen (TYLENOL) 500 MG tablet Take 1,000 mg by mouth every 6 (six) hours as needed for moderate pain or headache.  . bisoprolol-hydrochlorothiazide (ZIAC) 5-6.25 MG tablet Take 1 tablet by mouth daily.  . Multiple Vitamin (MULTIVITAMIN WITH MINERALS) TABS tablet Take 1 tablet by mouth every evening.  . [DISCONTINUED] bisoprolol-hydrochlorothiazide (ZIAC) 2.5-6.25 MG tablet Take 1 tablet by mouth every evening.  Marland Kitchen levothyroxine (SYNTHROID, LEVOTHROID) 50 MCG tablet Take 50 mcg by mouth daily.    No facility-administered encounter medications on file as of 03/28/2019.    Surgical History: Past Surgical History:  Procedure Laterality Date  . BILATERAL SALPINGECTOMY Bilateral 12/11/2018   Procedure: BILATERAL SALPINGECTOMY;  Surgeon: Schermerhorn, Gwen Her, MD;  Location: [facility] ORS;  Service: Gynecology;  Laterality: Bilateral;  . CHOLECYSTECTOMY N/A 12/03/2015   Procedure: LAPAROSCOPIC CHOLECYSTECTOMY WITH INTRAOPERATIVE CHOLANGIOGRAM;  Surgeon: Christene Lye, MD;  Location: [facility];  Service: General;  Laterality: N/A;  . TONSILLECTOMY    . VAGINAL  HYSTERECTOMY N/A 12/11/2018   Procedure: HYSTERECTOMY VAGINAL;  Surgeon: Schermerhorn, Gwen Her, MD;  Location: [facility] ORS;  Service: Gynecology;  Laterality: N/A;  . WISDOM TOOTH EXTRACTION      Medical History: Past Medical History:  Diagnosis Date  . GERD (gastroesophageal reflux disease)   . Hypertension   . Hypothyroidism   . Thyroid disease     Family History: Family History  Problem Relation Age of Onset  . Diabetes Father     Social History   Socioeconomic History  . Marital status: Married    Spouse name: Not on file  . Number of children: Not on file  . Years of education: Not on file  . Highest education level: Not on file  Occupational History  . Not on file  Tobacco Use  . Smoking status: Never Smoker  . Smokeless tobacco: Never Used  Substance and Sexual Activity  . Alcohol use: Yes    Comment: OCC WINE  . Drug use: No  . Sexual activity: Not on file  Other Topics Concern  . Not on file  Social History Narrative  . Not on file   Social Determinants of Health   Financial Resource Strain:   . Difficulty of Paying Living Expenses:   Food Insecurity:   . Worried About Charity fundraiser in the Last Year:   . Arboriculturist in the Last Year:   Transportation Needs:   . Film/video editor (Medical):   Marland Kitchen Lack of Transportation (Non-Medical):   Physical Activity:   . Days of Exercise per Week:   . Minutes of Exercise per Session:  Stress:   . Feeling of Stress :   Social Connections:   . Frequency of Communication with Friends and Family:   . Frequency of Social Gatherings with Friends and Family:   . Attends Religious Services:   . Active Member of Clubs or Organizations:   . Attends Archivist Meetings:   Marland Kitchen Marital Status:   Intimate Partner Violence:   . Fear of Current or Ex-Partner:   . Emotionally Abused:   Marland Kitchen Physically Abused:   . Sexually Abused:       Review of Systems  Constitutional: Negative for chills, fatigue  and unexpected weight change.  HENT: Negative for congestion, rhinorrhea, sneezing and sore throat.   Eyes: Negative for photophobia, pain and redness.  Respiratory: Negative for cough, chest tightness and shortness of breath.   Cardiovascular: Negative for chest pain and palpitations.  Gastrointestinal: Negative for abdominal pain, constipation, diarrhea, nausea and vomiting.  Endocrine: Negative.   Genitourinary: Negative for dysuria and frequency.  Musculoskeletal: Negative for arthralgias, back pain, joint swelling and neck pain.  Skin: Negative for rash.  Allergic/Immunologic: Negative.   Neurological: Negative for tremors and numbness.  Hematological: Negative for adenopathy. Does not bruise/bleed easily.  Psychiatric/Behavioral: Negative for behavioral problems and sleep disturbance. The patient is not nervous/anxious.     Vital Signs: BP 128/88   Pulse 75   Temp (!) 97.5 F (36.4 C)   Resp 16   Ht 5\' 3"  (1.6 m)   Wt 197 lb 6.4 oz (89.5 kg)   SpO2 98%   BMI 34.97 kg/m    Physical Exam Vitals and nursing note reviewed.  Constitutional:      General: She is not in acute distress.    Appearance: She is well-developed. She is not diaphoretic.  HENT:     Head: Normocephalic and atraumatic.     Mouth/Throat:     Pharynx: No oropharyngeal exudate.  Eyes:     Pupils: Pupils are equal, round, and reactive to light.  Neck:     Thyroid: No thyromegaly.     Vascular: No JVD.     Trachea: No tracheal deviation.  Cardiovascular:     Rate and Rhythm: Normal rate and regular rhythm.     Heart sounds: Normal heart sounds. No murmur. No friction rub. No gallop.   Pulmonary:     Effort: Pulmonary effort is normal. No respiratory distress.     Breath sounds: Normal breath sounds. No wheezing or rales.  Chest:     Chest wall: No tenderness.  Abdominal:     Palpations: Abdomen is soft.     Tenderness: There is no abdominal tenderness. There is no guarding.  Musculoskeletal:         General: Normal range of motion.     Cervical back: Normal range of motion and neck supple.  Lymphadenopathy:     Cervical: No cervical adenopathy.  Skin:    General: Skin is warm and dry.  Neurological:     Mental Status: She is alert and oriented to person, place, and time.     Cranial Nerves: No cranial nerve deficit.  Psychiatric:        Behavior: Behavior normal.        Thought Content: Thought content normal.        Judgment: Judgment normal.    Assessment/Plan: 1. Essential hypertension, benign Take mediation when you leave office.  Continue to monitor pressure at home.  Contact office for elevated pressures.   2.  Hypothyroidism, unspecified type Pt seen endocrinology for management. Continue to follow their recommendations.   General Counseling: Kametria verbalizes understanding of the findings of todays visit and agrees with plan of treatment. I have discussed any further diagnostic evaluation that may be needed or ordered today. We also reviewed her medications today. she has been encouraged to call the office with any questions or concerns that should arise related to todays visit.    No orders of the defined types were placed in this encounter.   No orders of the defined types were placed in this encounter.   Time spent: 25 Minutes   This patient was seen by Orson Gear AGNP-C in Collaboration with Dr Lavera Guise as a part of collaborative care agreement     Kendell Bane AGNP-C Internal medicine

## 2019-04-23 DIAGNOSIS — E039 Hypothyroidism, unspecified: Secondary | ICD-10-CM | POA: Diagnosis not present

## 2019-04-30 DIAGNOSIS — E669 Obesity, unspecified: Secondary | ICD-10-CM | POA: Diagnosis not present

## 2019-04-30 DIAGNOSIS — E039 Hypothyroidism, unspecified: Secondary | ICD-10-CM | POA: Diagnosis not present

## 2019-06-15 DIAGNOSIS — E039 Hypothyroidism, unspecified: Secondary | ICD-10-CM | POA: Diagnosis not present

## 2019-08-02 ENCOUNTER — Other Ambulatory Visit: Payer: Self-pay | Admitting: Adult Health

## 2019-08-02 DIAGNOSIS — I10 Essential (primary) hypertension: Secondary | ICD-10-CM

## 2019-08-22 DIAGNOSIS — E039 Hypothyroidism, unspecified: Secondary | ICD-10-CM | POA: Diagnosis not present

## 2019-08-29 DIAGNOSIS — E669 Obesity, unspecified: Secondary | ICD-10-CM | POA: Diagnosis not present

## 2019-08-29 DIAGNOSIS — E039 Hypothyroidism, unspecified: Secondary | ICD-10-CM | POA: Diagnosis not present

## 2019-09-25 ENCOUNTER — Telehealth: Payer: Self-pay

## 2019-09-25 NOTE — Telephone Encounter (Signed)
Confirmed and screened for 09-27-19 ov. 

## 2019-09-27 ENCOUNTER — Ambulatory Visit: Payer: BLUE CROSS/BLUE SHIELD | Admitting: Hospice and Palliative Medicine

## 2019-10-10 DIAGNOSIS — E039 Hypothyroidism, unspecified: Secondary | ICD-10-CM | POA: Diagnosis not present

## 2019-10-17 ENCOUNTER — Ambulatory Visit: Payer: BLUE CROSS/BLUE SHIELD | Admitting: Hospice and Palliative Medicine

## 2019-10-17 ENCOUNTER — Encounter: Payer: Self-pay | Admitting: Hospice and Palliative Medicine

## 2019-10-17 ENCOUNTER — Other Ambulatory Visit: Payer: Self-pay

## 2019-10-17 DIAGNOSIS — Z6838 Body mass index (BMI) 38.0-38.9, adult: Secondary | ICD-10-CM

## 2019-10-17 DIAGNOSIS — I10 Essential (primary) hypertension: Secondary | ICD-10-CM

## 2019-10-17 DIAGNOSIS — Z0001 Encounter for general adult medical examination with abnormal findings: Secondary | ICD-10-CM | POA: Diagnosis not present

## 2019-10-17 DIAGNOSIS — Z23 Encounter for immunization: Secondary | ICD-10-CM

## 2019-10-17 DIAGNOSIS — Z00121 Encounter for routine child health examination with abnormal findings: Secondary | ICD-10-CM

## 2019-10-17 NOTE — Progress Notes (Signed)
[facility] [place], [place] [address]  Internal MEDICINE  Office Visit Note  Patient Name: Miranda Briggs  [id number]  [id number]  Date of Service: 10/18/2019  Chief Complaint  Patient presents with  . Follow-up  . Hypertension  . policy update form    received    HPI Patient is here for routine follow-up History of HTN--reports has been well controlled, no complications with her medications, denies headaches or changes in her vision She is followed by endocrinology for hypothyroidism--no recent changes in her medications Today she would like to discuss issues with weight loss Since having her second child she has struggled with getting her weight back down She has been trying to focus on healthy eating but has not noticed any significant improvement in her weight   Current Medication: Outpatient Encounter Medications as of 10/17/2019  Medication Sig  . acetaminophen (TYLENOL) 500 MG tablet Take 1,000 mg by mouth every 6 (six) hours as needed for moderate pain or headache.  . bisoprolol-hydrochlorothiazide (ZIAC) 5-6.25 MG tablet TAKE 1 TABLET BY MOUTH EVERY DAY  . levothyroxine (SYNTHROID) 50 MCG tablet Take 50 mcg by mouth daily before breakfast.  . Multiple Vitamin (MULTIVITAMIN WITH MINERALS) TABS tablet Take 1 tablet by mouth every evening.  Marland Kitchen levothyroxine (SYNTHROID, LEVOTHROID) 50 MCG tablet Take 50 mcg by mouth daily.    No facility-administered encounter medications on file as of 10/17/2019.    Surgical History: Past Surgical History:  Procedure Laterality Date  . BILATERAL SALPINGECTOMY Bilateral 12/11/2018   Procedure: BILATERAL SALPINGECTOMY;  Surgeon: Schermerhorn, Gwen Her, MD;  Location: [facility];  Service: Gynecology;  Laterality: Bilateral;  . CHOLECYSTECTOMY N/A 12/03/2015   Procedure: LAPAROSCOPIC CHOLECYSTECTOMY WITH INTRAOPERATIVE CHOLANGIOGRAM;  Surgeon: Christene Lye, MD;  Location: [facility];  Service: General;   Laterality: N/A;  . TONSILLECTOMY    . VAGINAL HYSTERECTOMY N/A 12/11/2018   Procedure: HYSTERECTOMY VAGINAL;  Surgeon: Schermerhorn, Gwen Her, MD;  Location: [facility];  Service: Gynecology;  Laterality: N/A;  . WISDOM TOOTH EXTRACTION      Medical History: Past Medical History:  Diagnosis Date  . GERD (gastroesophageal reflux disease)   . Hypertension   . Hypothyroidism   . Thyroid disease     Family History: Family History  Problem Relation Age of Onset  . Diabetes Father     Social History   Socioeconomic History  . Marital status: Married    Spouse name: Not on file  . Number of children: Not on file  . Years of education: Not on file  . Highest education level: Not on file  Occupational History  . Not on file  Tobacco Use  . Smoking status: Never Smoker  . Smokeless tobacco: Never Used  Vaping Use  . Vaping Use: Never used  Substance and Sexual Activity  . Alcohol use: Yes    Comment: OCC WINE  . Drug use: No  . Sexual activity: Not on file  Other Topics Concern  . Not on file  Social History Narrative  . Not on file   Social Determinants of Health   Financial Resource Strain:   . Difficulty of Paying Living Expenses: Not on file  Food Insecurity:   . Worried About Charity fundraiser in the Last Year: Not on file  . Ran Out of Food in the Last Year: Not on file  Transportation Needs:   . Lack of Transportation (Medical): Not on file  . Lack of Transportation (Non-Medical): Not on  file  Physical Activity:   . Days of Exercise per Week: Not on file  . Minutes of Exercise per Session: Not on file  Stress:   . Feeling of Stress : Not on file  Social Connections:   . Frequency of Communication with Friends and Family: Not on file  . Frequency of Social Gatherings with Friends and Family: Not on file  . Attends Religious Services: Not on file  . Active Member of Clubs or Organizations: Not on file  . Attends Archivist Meetings: Not on file   . Marital Status: Not on file  Intimate Partner Violence:   . Fear of Current or Ex-Partner: Not on file  . Emotionally Abused: Not on file  . Physically Abused: Not on file  . Sexually Abused: Not on file   Review of Systems  Constitutional: Negative for chills, diaphoresis and fatigue.  HENT: Negative for ear pain, postnasal drip and sinus pressure.   Eyes: Negative for photophobia, discharge, redness, itching and visual disturbance.  Respiratory: Negative for cough, shortness of breath and wheezing.   Cardiovascular: Negative for chest pain, palpitations and leg swelling.  Gastrointestinal: Negative for abdominal pain, constipation, diarrhea, nausea and vomiting.  Genitourinary: Negative for dysuria and flank pain.  Musculoskeletal: Negative for arthralgias, back pain, gait problem and neck pain.  Skin: Negative for color change.  Allergic/Immunologic: Negative for environmental allergies and food allergies.  Neurological: Negative for dizziness and headaches.  Hematological: Does not bruise/bleed easily.  Psychiatric/Behavioral: Negative for agitation, behavioral problems (depression) and hallucinations.    Vital Signs: BP 126/82   Pulse 85   Temp 97.7 F (36.5 C)   Resp 16   Ht 5\' 2"  (1.575 m)   Wt 209 lb 9.6 oz (95.1 kg)   SpO2 97%   BMI 38.34 kg/m    Physical Exam Vitals reviewed.  Constitutional:      Appearance: Normal appearance. She is obese.  Cardiovascular:     Rate and Rhythm: Normal rate and regular rhythm.     Pulses: Normal pulses.     Heart sounds: Normal heart sounds.  Pulmonary:     Effort: Pulmonary effort is normal.     Breath sounds: Normal breath sounds.  Musculoskeletal:        General: Normal range of motion.     Cervical back: Normal range of motion.  Skin:    General: Skin is warm.  Neurological:     General: No focal deficit present.     Mental Status: She is alert and oriented to person, place, and time. Mental status is at  baseline.  Psychiatric:        Mood and Affect: Mood normal.        Behavior: Behavior normal.        Thought Content: Thought content normal.    Assessment/Plan: 1. BMI 97.3-53.2,DJMEQ Metabolic test recommends 6834 calories per day to achieve weight loss Goal 1600-1800 calories per day, 3-4 days of light exercise for 20 minutes Keep food diary as well as exercise log Encouraged to use calorie counting app and smart watch to help with exercise reminders Will review B12 and thyroid levels Agreed to try this approach for 2 months with a goal of 8 pounds lost at that time May consider low dose phentermine/topamax at that time - B12 - TSH + free T4 - Metabolic Test  2. Essential hypertension, benign BP and HR well controlled today on current therapy, continue with routine monitoring  3. Flu  vaccine need - Flu Vaccine MDCK QUAD PF  4. Encounter for routine adult health examination with abnormal findings Labs ordered for upcoming CPE - CBC w/Diff/Platelet - Comprehensive Metabolic Panel (CMET) - Lipid Panel With LDL/HDL Ratio - Vitamin D (25 hydroxy)  General Counseling: Corita verbalizes understanding of the findings of todays visit and agrees with plan of treatment. I have discussed any further diagnostic evaluation that may be needed or ordered today. We also reviewed her medications today. she has been encouraged to call the office with any questions or concerns that should arise related to todays visit.  Obesity Counseling: Risk Assessment: An assessment of behavioral risk factors was made today and includes lack of exercise sedentary lifestyle, lack of portion control and poor dietary habits.  Risk Modification Advice: She was counseled on portion control guidelines. Restricting daily caloric intake to 1600-1800. The detrimental long term effects of obesity on her health and ongoing poor compliance was also discussed with the patient.  Orders Placed This Encounter  Procedures   . Metabolic Test  . Flu Vaccine MDCK QUAD PF  . CBC w/Diff/Platelet  . Comprehensive Metabolic Panel (CMET)  . Lipid Panel With LDL/HDL Ratio  . B12  . Vitamin D (25 hydroxy)  . TSH + free T4      Time spent: 30 Minutes Time spent includes review of chart, medications, test results and follow-up plan with the patient.  This patient was seen by Theodoro Grist AGNP-C in Collaboration with Dr Lavera Guise as a part of collaborative care agreement     Tanna Furry. Raekwan Spelman AGNP-C Internal medicine

## 2019-10-18 ENCOUNTER — Encounter: Payer: Self-pay | Admitting: Hospice and Palliative Medicine

## 2019-11-05 ENCOUNTER — Ambulatory Visit: Payer: BLUE CROSS/BLUE SHIELD | Admitting: Nurse Practitioner

## 2019-11-05 ENCOUNTER — Encounter: Payer: Self-pay | Admitting: Nurse Practitioner

## 2019-11-05 VITALS — Temp 97.6°F | Resp 16 | Ht 62.0 in | Wt 203.0 lb

## 2019-11-05 DIAGNOSIS — J0140 Acute pansinusitis, unspecified: Secondary | ICD-10-CM | POA: Diagnosis not present

## 2019-11-05 DIAGNOSIS — R059 Cough, unspecified: Secondary | ICD-10-CM | POA: Insufficient documentation

## 2019-11-05 MED ORDER — AZITHROMYCIN 250 MG PO TABS: ORAL_TABLET | ORAL | 0 refills | Status: DC

## 2019-11-05 NOTE — Progress Notes (Signed)
[facility] [place], [place] [address]  Internal MEDICINE  Telephone Visit  Patient Name: Miranda Briggs  [id number]  [id number]  Date of Service: 11/05/2019  I connected with the patient at 3:45pm by telephone and verified the patients identity using two identifiers.   I discussed the limitations, risks, security and privacy concerns of performing an evaluation and management service by telephone and the availability of in person appointments. I also discussed with the patient that there may be a patient responsible charge related to the service.  The patient expressed understanding and agrees to proceed.    Chief Complaint  Patient presents with   Telephone Screen   Telephone Assessment   Acute Visit   Sinusitis   Nasal Congestion    The patient has been contacted via telephone for follow up visit due to concerns for spread of novel coronavirus. Today, she presents for sick visit. She states that she has had a lot of sinus drainage, sore throat, hoarseness since middle of last week. She has been taking mucinex with very little relief. She denies fever, headache, or body aches, or chills. She is able to smell and taste without difficulty. She states that she has been around no one with COVID 19.       Current Medication: Outpatient Encounter Medications as of 11/05/2019  Medication Sig   acetaminophen (TYLENOL) 500 MG tablet Take 1,000 mg by mouth every 6 (six) hours as needed for moderate pain or headache.   bisoprolol-hydrochlorothiazide (ZIAC) 5-6.25 MG tablet TAKE 1 TABLET BY MOUTH EVERY DAY   levothyroxine (SYNTHROID) 50 MCG tablet Take 50 mcg by mouth daily before breakfast.   Multiple Vitamin (MULTIVITAMIN WITH MINERALS) TABS tablet Take 1 tablet by mouth every evening.   azithromycin (ZITHROMAX) 250 MG tablet z-pack - take as directed for 5 days   levothyroxine (SYNTHROID, LEVOTHROID) 50 MCG tablet Take 50 mcg by mouth daily.    No  facility-administered encounter medications on file as of 11/05/2019.    Surgical History: Past Surgical History:  Procedure Laterality Date   BILATERAL SALPINGECTOMY Bilateral 12/11/2018   Procedure: BILATERAL SALPINGECTOMY;  Surgeon: Schermerhorn, Gwen Her, MD;  Location: [facility];  Service: Gynecology;  Laterality: Bilateral;   CHOLECYSTECTOMY N/A 12/03/2015   Procedure: LAPAROSCOPIC CHOLECYSTECTOMY WITH INTRAOPERATIVE CHOLANGIOGRAM;  Surgeon: Christene Lye, MD;  Location: [facility] ORS;  Service: General;  Laterality: N/A;   TONSILLECTOMY     VAGINAL HYSTERECTOMY N/A 12/11/2018   Procedure: HYSTERECTOMY VAGINAL;  Surgeon: Boykin Nearing, MD;  Location: [facility];  Service: Gynecology;  Laterality: N/A;   WISDOM TOOTH EXTRACTION      Medical History: Past Medical History:  Diagnosis Date   GERD (gastroesophageal reflux disease)    Hypertension    Hypothyroidism    Thyroid disease     Family History: Family History  Problem Relation Age of Onset   Diabetes Father     Social History   Socioeconomic History   Marital status: Married    Spouse name: Not on file   Number of children: Not on file   Years of education: Not on file   Highest education level: Not on file  Occupational History   Not on file  Tobacco Use   Smoking status: Never Smoker   Smokeless tobacco: Never Used  Vaping Use   Vaping Use: Never used  Substance and Sexual Activity   Alcohol use: Yes    Comment: [place] WINE   Drug use: No  Sexual activity: Not on file  Other Topics Concern   Not on file  Social History Narrative   Not on file   Social Determinants of Health   Financial Resource Strain:    Difficulty of Paying Living Expenses: Not on file  Food Insecurity:    Worried About Fidelis in the Last Year: Not on file   Ran Out of Food in the Last Year: Not on file  Transportation Needs:    Lack of Transportation (Medical): Not on file    Lack of Transportation (Non-Medical): Not on file  Physical Activity:    Days of Exercise per Week: Not on file   Minutes of Exercise per Session: Not on file  Stress:    Feeling of Stress : Not on file  Social Connections:    Frequency of Communication with Friends and Family: Not on file   Frequency of Social Gatherings with Friends and Family: Not on file   Attends Religious Services: Not on file   Active Member of Clubs or Organizations: Not on file   Attends Archivist Meetings: Not on file   Marital Status: Not on file  Intimate Partner Violence:    Fear of Current or Ex-Partner: Not on file   Emotionally Abused: Not on file   Physically Abused: Not on file   Sexually Abused: Not on file      Review of Systems  Constitutional: Positive for chills. Negative for fatigue and fever.  HENT: Positive for congestion, postnasal drip, rhinorrhea, sinus pressure, sinus pain, sore throat and voice change. Negative for ear pain.   Respiratory: Positive for cough. Negative for wheezing.   Cardiovascular: Negative for chest pain and palpitations.  Gastrointestinal: Positive for nausea.  Allergic/Immunologic: Negative.   Neurological: Positive for headaches.  Psychiatric/Behavioral: Negative.     Today's Vitals   11/05/19 1427  Resp: 16  Temp: 97.6 F (36.4 C)  Weight: 203 lb (92.1 kg)  Height: 5\' 2"  (1.575 m)    Observation/Objective:   The patient is alert and oriented. She is pleasant and answers all questions appropriately. Breathing is non-labored. She is in no acute distress at this time.  She is nasally congested and is hoarse.    Assessment/Plan: 1. Acute non-recurrent pansinusitis Start z-pack. Take as directed for 5 days. Rest and increase fluids. Recommend adding tylenol and/or ibuprofen for pain and headache.  - azithromycin (ZITHROMAX) 250 MG tablet; z-pack - take as directed for 5 days  Dispense: 6 tablet; Refill: 0  2. Cough Recommend  OTC delsym twice daily as needed for cough.   General Counseling: Keyry verbalizes understanding of the findings of today's phone visit and agrees with plan of treatment. I have discussed any further diagnostic evaluation that may be needed or ordered today. We also reviewed her medications today. she has been encouraged to call the office with any questions or concerns that should arise related to todays visit.  This patient was seen by Leretha Pol FNP Collaboration with Dr Lavera Guise as a part of collaborative care agreement  Meds ordered this encounter  Medications   azithromycin (ZITHROMAX) 250 MG tablet    Sig: z-pack - take as directed for 5 days    Dispense:  6 tablet    Refill:  0    Order Specific Question:   Supervising Provider    Answer:   Lavera Guise [[id number]]    Time spent: 25 Minutes    Dr Lavera Guise  Internal medicine

## 2019-11-08 DIAGNOSIS — Z01411 Encounter for gynecological examination (general) (routine) with abnormal findings: Secondary | ICD-10-CM | POA: Diagnosis not present

## 2019-11-08 DIAGNOSIS — Z1272 Encounter for screening for malignant neoplasm of vagina: Secondary | ICD-10-CM | POA: Diagnosis not present

## 2019-11-08 DIAGNOSIS — N898 Other specified noninflammatory disorders of vagina: Secondary | ICD-10-CM | POA: Diagnosis not present

## 2019-11-08 DIAGNOSIS — Z0001 Encounter for general adult medical examination with abnormal findings: Secondary | ICD-10-CM | POA: Diagnosis not present

## 2019-11-08 DIAGNOSIS — Z1331 Encounter for screening for depression: Secondary | ICD-10-CM | POA: Diagnosis not present

## 2019-11-09 LAB — COMPREHENSIVE METABOLIC PANEL
ALT: 26 IU/L (ref 0–32)
AST: 20 IU/L (ref 0–40)
Albumin/Globulin Ratio: 2.3 — ABNORMAL HIGH (ref 1.2–2.2)
Albumin: 4.8 g/dL (ref 3.9–5.0)
Alkaline Phosphatase: 81 IU/L (ref 44–121)
BUN/Creatinine Ratio: 13 (ref 9–23)
BUN: 10 mg/dL (ref 6–20)
Bilirubin Total: 0.9 mg/dL (ref 0.0–1.2)
CO2: 23 mmol/L (ref 20–29)
Calcium: 9.6 mg/dL (ref 8.7–10.2)
Chloride: 100 mmol/L (ref 96–106)
Creatinine, Ser: 0.79 mg/dL (ref 0.57–1.00)
GFR calc Af Amer: 116 mL/min/{1.73_m2} (ref >59)
GFR calc non Af Amer: 101 mL/min/{1.73_m2} (ref >59)
Globulin, Total: 2.1 g/dL (ref 1.5–4.5)
Glucose: 79 mg/dL (ref 65–99)
Potassium: 3.9 mmol/L (ref 3.5–5.2)
Sodium: 141 mmol/L (ref 134–144)
Total Protein: 6.9 g/dL (ref 6.0–8.5)

## 2019-11-09 LAB — CBC WITH DIFFERENTIAL/PLATELET
Basophils Absolute: 0.1 10*3/uL (ref 0.0–0.2)
Basos: 1 %
EOS (ABSOLUTE): 0.2 10*3/uL (ref 0.0–0.4)
Eos: 2 %
Hematocrit: 40.5 % (ref 34.0–46.6)
Hemoglobin: 14.4 g/dL (ref 11.1–15.9)
Immature Grans (Abs): 0 10*3/uL (ref 0.0–0.1)
Immature Granulocytes: 0 %
Lymphocytes Absolute: 2.3 10*3/uL (ref 0.7–3.1)
Lymphs: 33 %
MCH: 32.2 pg (ref 26.6–33.0)
MCHC: 35.6 g/dL (ref 31.5–35.7)
MCV: 91 fL (ref 79–97)
Monocytes Absolute: 0.6 10*3/uL (ref 0.1–0.9)
Monocytes: 9 %
Neutrophils Absolute: 3.7 10*3/uL (ref 1.4–7.0)
Neutrophils: 55 %
Platelets: 348 10*3/uL (ref 150–450)
RBC: 4.47 x10E6/uL (ref 3.77–5.28)
RDW: 12.1 % (ref 11.7–15.4)
WBC: 6.8 10*3/uL (ref 3.4–10.8)

## 2019-11-09 LAB — LIPID PANEL WITH LDL/HDL RATIO
Cholesterol, Total: 173 mg/dL (ref 100–199)
HDL: 43 mg/dL (ref >39)
LDL Chol Calc (NIH): 102 mg/dL — ABNORMAL HIGH (ref 0–99)
LDL/HDL Ratio: 2.4 ratio (ref 0.0–3.2)
Triglycerides: 160 mg/dL — ABNORMAL HIGH (ref 0–149)
VLDL Cholesterol Cal: 28 mg/dL (ref 5–40)

## 2019-11-09 LAB — VITAMIN B12: Vitamin B-12: 534 pg/mL (ref 232–1245)

## 2019-11-09 LAB — TSH+FREE T4
Free T4: 1.47 ng/dL (ref 0.82–1.77)
TSH: 2.09 u[IU]/mL (ref 0.450–4.500)

## 2019-11-09 LAB — VITAMIN D 25 HYDROXY (VIT D DEFICIENCY, FRACTURES): Vit D, 25-Hydroxy: 31.9 ng/mL (ref 30.0–100.0)

## 2019-11-09 NOTE — Progress Notes (Signed)
Labs reviewed, will discuss at next visit.

## 2019-12-12 DIAGNOSIS — N898 Other specified noninflammatory disorders of vagina: Secondary | ICD-10-CM | POA: Diagnosis not present

## 2019-12-17 ENCOUNTER — Other Ambulatory Visit: Payer: Self-pay

## 2019-12-17 ENCOUNTER — Ambulatory Visit: Payer: BLUE CROSS/BLUE SHIELD | Admitting: Nurse Practitioner

## 2019-12-17 ENCOUNTER — Encounter: Payer: Self-pay | Admitting: Nurse Practitioner

## 2019-12-17 VITALS — BP 122/80 | HR 75 | Temp 97.0°F | Resp 16 | Ht 63.0 in | Wt 209.6 lb

## 2019-12-17 DIAGNOSIS — E039 Hypothyroidism, unspecified: Secondary | ICD-10-CM | POA: Diagnosis not present

## 2019-12-17 DIAGNOSIS — Z6837 Body mass index (BMI) 37.0-37.9, adult: Secondary | ICD-10-CM

## 2019-12-17 DIAGNOSIS — I10 Essential (primary) hypertension: Secondary | ICD-10-CM | POA: Diagnosis not present

## 2019-12-17 MED ORDER — PHENTERMINE-TOPIRAMATE ER 7.5-46 MG PO CP24: 1.0000 | ORAL_CAPSULE | Freq: Every day | ORAL | 0 refills | Status: DC

## 2019-12-17 NOTE — Progress Notes (Signed)
[facility] [place], [place] [address]  Internal MEDICINE  Office Visit Note  Patient Name: Miranda Briggs  [id number]  [id number]  Date of Service: 01/13/2020  Chief Complaint  Patient presents with  . Follow-up    Weight loss  . Hypertension  . Gastroesophageal Reflux    The patient is here to discuss options for weight management  -has struggled with weight loss isnce having her second child who is now 72 months old. She is eating a low-calorie diet. She is exercising routinely.  -had labs checked, including thyroid panel since most recent visit. Mild elevation of LDL and total cholesterol TSH and Free T4 were both normal. Thyroid has been monitored closely as panel had been "out of whack" since she had second child.  -had been concentrating on weight loss with diet and lifestyle techniques for past 2 months. Goal was to lose 8 pounds. Her weight has been stable. No weight gain or loss.  -blood pressure has been stable with current medication.       Current Medication: Outpatient Encounter Medications as of 12/17/2019  Medication Sig  . acetaminophen (TYLENOL) 500 MG tablet Take 1,000 mg by mouth every 6 (six) hours as needed for moderate pain or headache.  Marland Kitchen azithromycin (ZITHROMAX) 250 MG tablet z-pack - take as directed for 5 days  . bisoprolol-hydrochlorothiazide (ZIAC) 5-6.25 MG tablet TAKE 1 TABLET BY MOUTH EVERY DAY  . levothyroxine (SYNTHROID) 50 MCG tablet Take 50 mcg by mouth daily before breakfast.  . Multiple Vitamin (MULTIVITAMIN WITH MINERALS) TABS tablet Take 1 tablet by mouth every evening.  Marland Kitchen levothyroxine (SYNTHROID, LEVOTHROID) 50 MCG tablet Take 50 mcg by mouth daily.   . [DISCONTINUED] Phentermine-Topiramate 7.5-46 MG CP24 Take 1 capsule by mouth daily.   No facility-administered encounter medications on file as of 12/17/2019.    Surgical History: Past Surgical History:  Procedure Laterality Date  . BILATERAL SALPINGECTOMY  Bilateral 12/11/2018   Procedure: BILATERAL SALPINGECTOMY;  Surgeon: Schermerhorn, Gwen Her, MD;  Location: [facility] ORS;  Service: Gynecology;  Laterality: Bilateral;  . CHOLECYSTECTOMY N/A 12/03/2015   Procedure: LAPAROSCOPIC CHOLECYSTECTOMY WITH INTRAOPERATIVE CHOLANGIOGRAM;  Surgeon: Christene Lye, MD;  Location: [facility] OR[facility];  Service: General;  Laterality: N/A;  . TONSILLECTOMY    . VAGINAL HYSTERECTOMY N/A 12/11/2018   Procedure: HYSTERECTOMY VAGINAL;  Surgeon: Schermerhorn, Gwen Her, MD;  Location: [facility] ORS;  Service: Gynecology;  Laterality: N/A;  . WISDOM TOOTH EXTRACTION      Medical History: Past Medical History:  Diagnosis Date  . GERD (gastroesophageal reflux disease)   . Hypertension   . Hypothyroidism   . Thyroid disease     Family History: Family History  Problem Relation Age of Onset  . Diabetes Father     Social History   Socioeconomic History  . Marital status: Married    Spouse name: Not on file  . Number of children: Not on file  . Years of education: Not on file  . Highest education level: Not on file  Occupational History  . Not on file  Tobacco Use  . Smoking status: Never Smoker  . Smokeless tobacco: Never Used  Vaping Use  . Vaping Use: Never used  Substance and Sexual Activity  . Alcohol use: Yes    Comment: OCC WINE  . Drug use: No  . Sexual activity: Not on file  Other Topics Concern  . Not on file  Social History Narrative  . Not on file   Social Determinants  of Health   Financial Resource Strain: Not on file  Food Insecurity: Not on file  Transportation Needs: Not on file  Physical Activity: Not on file  Stress: Not on file  Social Connections: Not on file  Intimate Partner Violence: Not on file      Review of Systems  Constitutional: Negative for chills, fatigue and unexpected weight change.       Six pound weight gain since most recent visit   HENT: Negative for congestion, postnasal drip, rhinorrhea, sneezing and sore  throat.   Respiratory: Negative for cough, chest tightness, shortness of breath and wheezing.   Cardiovascular: Negative for chest pain and palpitations.  Gastrointestinal: Negative for abdominal pain, constipation, diarrhea, nausea and vomiting.  Endocrine: Negative for cold intolerance, heat intolerance, polydipsia and polyuria.       Thyroid panel stable with current medication.  Musculoskeletal: Negative for arthralgias, back pain, joint swelling and neck pain.  Skin: Negative for rash.  Neurological: Negative for dizziness, tremors, numbness and headaches.  Hematological: Negative for adenopathy. Does not bruise/bleed easily.  Psychiatric/Behavioral: Negative for behavioral problems (Depression), sleep disturbance and suicidal ideas. The patient is not nervous/anxious.     Today's Vitals   12/17/19 1037  BP: 122/80  Pulse: 75  Resp: 16  Temp: (!) 97 F (36.1 C)  SpO2: 98%  Weight: 209 lb 9.6 oz (95.1 kg)  Height: 5\' 3"  (1.6 m)   Body mass index is 37.13 kg/m.  Physical Exam Vitals and nursing note reviewed.  Constitutional:      General: She is not in acute distress.    Appearance: Normal appearance. She is well-developed and well-nourished. She is obese. She is not diaphoretic.  HENT:     Head: Normocephalic and atraumatic.     Nose: Nose normal.     Mouth/Throat:     Mouth: Oropharynx is clear and moist.     Pharynx: No oropharyngeal exudate.  Eyes:     Extraocular Movements: EOM normal.     Pupils: Pupils are equal, round, and reactive to light.  Neck:     Thyroid: No thyromegaly.     Vascular: No JVD.     Trachea: No tracheal deviation.  Cardiovascular:     Rate and Rhythm: Normal rate and regular rhythm.     Heart sounds: Normal heart sounds. No murmur heard. No friction rub. No gallop.   Pulmonary:     Effort: Pulmonary effort is normal. No respiratory distress.     Breath sounds: Normal breath sounds. No wheezing or rales.  Chest:     Chest wall: No  tenderness.  Abdominal:     Palpations: Abdomen is soft.  Musculoskeletal:        General: Normal range of motion.     Cervical back: Normal range of motion and neck supple.  Lymphadenopathy:     Cervical: No cervical adenopathy.  Skin:    General: Skin is warm and dry.  Neurological:     Mental Status: She is alert and oriented to person, place, and time.     Cranial Nerves: No cranial nerve deficit.  Psychiatric:        Mood and Affect: Mood and affect and mood normal.        Behavior: Behavior normal.        Thought Content: Thought content normal.        Judgment: Judgment normal.    Assessment/Plan: 1. Essential hypertension, benign Stable with current medication. Continue as prescribed  2. Acquired hypothyroidism Thyroid panel stable. Continue levothyroxine as prescribed   3. BMI 37.0-37.9, adult Reviewed all labs with the patient. Overall, look good. Trial of phentermine/topirimate 7.5/45mg  daily. Advised she limit calorie intake to 1200-1500 calories per day and continue to incorporate exercise into daily routine.   General Counseling: Tocara verbalizes understanding of the findings of todays visit and agrees with plan of treatment. I have discussed any further diagnostic evaluation that may be needed or ordered today. We also reviewed her medications today. she has been encouraged to call the office with any questions or concerns that should arise related to todays visit.  Obesity Counseling: Risk Assessment: An assessment of behavioral risk factors was made today and includes lack of exercise sedentary lifestyle, lack of portion control and poor dietary habits.  Risk Modification Advice: She was counseled on portion control guidelines. Restricting daily caloric intake to 1200-1500 calories per day. The detrimental long term effects of obesity on her health and ongoing poor compliance was also discussed with the patient.  This patient was seen by [place] with Dr Lavera Guise as a part of collaborative care agreement  Meds ordered this encounter  Medications  . DISCONTD: Phentermine-Topiramate 7.5-46 MG CP24    Sig: Take 1 capsule by mouth daily.    Dispense:  30 capsule    Refill:  0    Order Specific Question:   Supervising Provider    Answer:   Lavera Guise [[id number]]    Total time spent: 30 Minutes   Time spent includes review of chart, medications, test results, and follow up plan with the patient.      Dr Lavera Guise Internal medicine

## 2019-12-25 ENCOUNTER — Encounter: Payer: BLUE CROSS/BLUE SHIELD | Admitting: Nurse Practitioner

## 2020-01-01 ENCOUNTER — Encounter: Payer: Self-pay | Admitting: Nurse Practitioner

## 2020-01-01 ENCOUNTER — Telehealth: Payer: Self-pay

## 2020-01-01 NOTE — Telephone Encounter (Signed)
Called and spoke to pt about phentermine prescription and advised pt to check with pharmacy on how much rx is out of pocket.  I told pt to check on [facility] also to see about medication price.

## 2020-01-02 ENCOUNTER — Other Ambulatory Visit: Payer: Self-pay | Admitting: Nurse Practitioner

## 2020-01-02 ENCOUNTER — Telehealth: Payer: Self-pay

## 2020-01-02 DIAGNOSIS — Z6838 Body mass index (BMI) 38.0-38.9, adult: Secondary | ICD-10-CM

## 2020-01-02 MED ORDER — TOPIRAMATE 25 MG PO TABS: 25.0000 mg | ORAL_TABLET | Freq: Every evening | ORAL | 0 refills | Status: DC

## 2020-01-02 MED ORDER — PHENTERMINE HCL 15 MG PO CAPS: 15.0000 mg | ORAL_CAPSULE | ORAL | 0 refills | Status: DC

## 2020-01-02 NOTE — Telephone Encounter (Signed)
I changed the prescription to phentermine 15mg  caps which she should take in the mornings. And topamax 25mg  every evening. These are the same medicatoins that are in original prescriptin but separate. I sent both prescriptions to [facility] in [place].

## 2020-01-02 NOTE — Telephone Encounter (Signed)
Pt informed of the change in prescriptions and they were sent to pharmacy

## 2020-01-13 DIAGNOSIS — I10 Essential (primary) hypertension: Secondary | ICD-10-CM | POA: Insufficient documentation

## 2020-01-13 DIAGNOSIS — Z6837 Body mass index (BMI) 37.0-37.9, adult: Secondary | ICD-10-CM | POA: Insufficient documentation

## 2020-01-17 ENCOUNTER — Encounter: Payer: BLUE CROSS/BLUE SHIELD | Admitting: Hospice and Palliative Medicine

## 2020-01-18 ENCOUNTER — Ambulatory Visit: Payer: BLUE CROSS/BLUE SHIELD | Admitting: Nurse Practitioner

## 2020-01-25 ENCOUNTER — Other Ambulatory Visit: Payer: Self-pay

## 2020-01-25 DIAGNOSIS — Z6838 Body mass index (BMI) 38.0-38.9, adult: Secondary | ICD-10-CM

## 2020-01-25 MED ORDER — TOPIRAMATE 25 MG PO TABS: 25.0000 mg | ORAL_TABLET | Freq: Every evening | ORAL | 0 refills | Status: DC

## 2020-02-04 ENCOUNTER — Ambulatory Visit: Payer: BLUE CROSS/BLUE SHIELD | Admitting: Hospice and Palliative Medicine

## 2020-02-04 ENCOUNTER — Encounter: Payer: Self-pay | Admitting: Hospice and Palliative Medicine

## 2020-02-04 VITALS — BP 118/80 | HR 72 | Temp 98.3°F | Resp 16 | Ht 62.0 in | Wt 208.6 lb

## 2020-02-04 DIAGNOSIS — Z6838 Body mass index (BMI) 38.0-38.9, adult: Secondary | ICD-10-CM

## 2020-02-04 DIAGNOSIS — N3000 Acute cystitis without hematuria: Secondary | ICD-10-CM

## 2020-02-04 DIAGNOSIS — R300 Dysuria: Secondary | ICD-10-CM | POA: Diagnosis not present

## 2020-02-04 LAB — POCT URINALYSIS DIPSTICK
Bilirubin, UA: NEGATIVE
Blood, UA: NEGATIVE
Glucose, UA: NEGATIVE
Ketones, UA: NEGATIVE
Leukocytes, UA: NEGATIVE
Nitrite, UA: NEGATIVE
Protein, UA: NEGATIVE
Spec Grav, UA: 1.01 (ref 1.010–1.025)
Urobilinogen, UA: 0.2 E.U./dL
pH, UA: 6.5 (ref 5.0–8.0)

## 2020-02-04 MED ORDER — NITROFURANTOIN MONOHYD MACRO 100 MG PO CAPS: 100.0000 mg | ORAL_CAPSULE | Freq: Two times a day (BID) | ORAL | 0 refills | Status: DC

## 2020-02-04 MED ORDER — PHENTERMINE HCL 15 MG PO CAPS: 15.0000 mg | ORAL_CAPSULE | ORAL | 0 refills | Status: DC

## 2020-02-04 NOTE — Progress Notes (Signed)
[facility] [place], [place] [address]  Internal MEDICINE  Office Visit Note  Patient Name: Miranda Briggs  [id number]  [id number]  Date of Service: 02/04/2020  Chief Complaint  Patient presents with  . Urinary Tract Infection  . Back Pain  . Urinary Frequency    Symptoms started yesterday     HPI Pt is here for a sick visit. C/o increased urinary frequency, irritation and low back pain//symptoms started yesterday She has been taking OTC Azo for symptom relief  Followed for weight loss--taking phentermine as well as Topamax Has lost one pound since last visit She continues to exercise routinely and is counting her calories to help achieve her weight loss goals This is her second consecutive month taking Phentermine--denies side effects from medications  Current Medication:  Outpatient Encounter Medications as of 02/04/2020  Medication Sig  . nitrofurantoin, macrocrystal-monohydrate, (MACROBID) 100 MG capsule Take 1 capsule (100 mg total) by mouth 2 (two) times daily.  Marland Kitchen acetaminophen (TYLENOL) 500 MG tablet Take 1,000 mg by mouth every 6 (six) hours as needed for moderate pain or headache.  Marland Kitchen azithromycin (ZITHROMAX) 250 MG tablet z-pack - take as directed for 5 days  . bisoprolol-hydrochlorothiazide (ZIAC) 5-6.25 MG tablet TAKE 1 TABLET BY MOUTH EVERY DAY  . levothyroxine (SYNTHROID) 50 MCG tablet Take 50 mcg by mouth daily before breakfast.  . levothyroxine (SYNTHROID, LEVOTHROID) 50 MCG tablet Take 50 mcg by mouth daily.   . Multiple Vitamin (MULTIVITAMIN WITH MINERALS) TABS tablet Take 1 tablet by mouth every evening.  . phentermine 15 MG capsule Take 1 capsule (15 mg total) by mouth every morning.  . topiramate (TOPAMAX) 25 MG tablet Take 1 tablet (25 mg total) by mouth every evening.  . [DISCONTINUED] phentermine 15 MG capsule Take 1 capsule (15 mg total) by mouth every morning.   No facility-administered encounter medications on file as of  02/04/2020.      Medical History: Past Medical History:  Diagnosis Date  . GERD (gastroesophageal reflux disease)   . Hypertension   . Hypothyroidism   . Thyroid disease      Vital Signs: BP 118/80   Pulse 72   Temp 98.3 F (36.8 C)   Resp 16   Ht 5\' 2"  (1.575 m)   Wt 208 lb 9.6 oz (94.6 kg)   SpO2 98%   BMI 38.15 kg/m    Review of Systems  Constitutional: Negative for chills, diaphoresis and fatigue.  HENT: Negative for ear pain, postnasal drip and sinus pressure.   Eyes: Negative for photophobia, discharge, redness, itching and visual disturbance.  Respiratory: Negative for cough, shortness of breath and wheezing.   Cardiovascular: Negative for chest pain, palpitations and leg swelling.  Gastrointestinal: Negative for abdominal pain, constipation, diarrhea, nausea and vomiting.  Genitourinary: Positive for dysuria, flank pain and frequency.  Musculoskeletal: Negative for arthralgias, back pain, gait problem and neck pain.  Skin: Negative for color change.  Allergic/Immunologic: Negative for environmental allergies and food allergies.  Neurological: Negative for dizziness and headaches.  Hematological: Does not bruise/bleed easily.  Psychiatric/Behavioral: Negative for agitation, behavioral problems (depression) and hallucinations.    Physical Exam Vitals reviewed.  Constitutional:      Appearance: Normal appearance. She is obese.  Cardiovascular:     Rate and Rhythm: Normal rate and regular rhythm.     Pulses: Normal pulses.     Heart sounds: Normal heart sounds.  Pulmonary:     Effort: Pulmonary effort is normal.  Breath sounds: Normal breath sounds.  Abdominal:     General: Abdomen is flat.  Musculoskeletal:        General: Normal range of motion.     Cervical back: Normal range of motion.  Skin:    General: Skin is warm.  Neurological:     General: No focal deficit present.     Mental Status: She is alert and oriented to person, place, and time.  Mental status is at baseline.  Psychiatric:        Mood and Affect: Mood normal.        Behavior: Behavior normal.        Thought Content: Thought content normal.        Judgment: Judgment normal.   Assessment/Plan: 1. Acute cystitis without hematuria Start Macrobid, will await culture results and adjust abx therapy as indicated - nitrofurantoin, macrocrystal-monohydrate, (MACROBID) 100 MG capsule; Take 1 capsule (100 mg total) by mouth 2 (two) times daily.  Dispense: 20 capsule; Refill: 0  2. BMI 38.0-38.9,adult Continue with phentermine as well as topamax Discussed intermittent fastin--calorie goal less than 1600 - phentermine 15 MG capsule; Take 1 capsule (15 mg total) by mouth every morning.  Dispense: 30 capsule; Refill: 0 Obesity Counseling: Risk Assessment: An assessment of behavioral risk factors was made today and includes lack of exercise sedentary lifestyle, lack of portion control and poor dietary habits.  Risk Modification Advice: She was counseled on portion control guidelines. Restricting daily caloric intake to 1600. The detrimental long term effects of obesity on her health and ongoing poor compliance was also discussed with the patient.  3. Dysuria - POCT Urinalysis Dipstick - CULTURE, URINE COMPREHENSIVE  General Counseling: Kerline verbalizes understanding of the findings of todays visit and agrees with plan of treatment. I have discussed any further diagnostic evaluation that may be needed or ordered today. We also reviewed her medications today. she has been encouraged to call the office with any questions or concerns that should arise related to todays visit.   Orders Placed This Encounter  Procedures  . CULTURE, URINE COMPREHENSIVE  . POCT Urinalysis Dipstick    Meds ordered this encounter  Medications  . nitrofurantoin, macrocrystal-monohydrate, (MACROBID) 100 MG capsule    Sig: Take 1 capsule (100 mg total) by mouth 2 (two) times daily.    Dispense:  20  capsule    Refill:  0  . phentermine 15 MG capsule    Sig: Take 1 capsule (15 mg total) by mouth every morning.    Dispense:  30 capsule    Refill:  0    Time spent: 30 Minutes  This patient was seen by Theodoro Grist AGNP-C in Collaboration with Dr Lavera Guise as a part of collaborative care agreement.  Tanna Furry [facility] Internal Medicine

## 2020-02-06 ENCOUNTER — Ambulatory Visit: Payer: BLUE CROSS/BLUE SHIELD | Admitting: Hospice and Palliative Medicine

## 2020-02-07 LAB — CULTURE, URINE COMPREHENSIVE

## 2020-02-26 ENCOUNTER — Other Ambulatory Visit: Payer: Self-pay | Admitting: Hospice and Palliative Medicine

## 2020-02-26 DIAGNOSIS — Z6838 Body mass index (BMI) 38.0-38.9, adult: Secondary | ICD-10-CM

## 2020-03-03 ENCOUNTER — Ambulatory Visit: Payer: BLUE CROSS/BLUE SHIELD | Admitting: Hospice and Palliative Medicine

## 2020-03-05 ENCOUNTER — Encounter: Payer: Self-pay | Admitting: Hospice and Palliative Medicine

## 2020-03-05 ENCOUNTER — Other Ambulatory Visit: Payer: Self-pay

## 2020-03-05 ENCOUNTER — Ambulatory Visit: Payer: BC Managed Care – PPO | Admitting: Hospice and Palliative Medicine

## 2020-03-05 VITALS — BP 120/90 | HR 70 | Temp 97.2°F | Resp 16 | Ht 62.5 in | Wt 207.2 lb

## 2020-03-05 DIAGNOSIS — I10 Essential (primary) hypertension: Secondary | ICD-10-CM | POA: Diagnosis not present

## 2020-03-05 DIAGNOSIS — E6609 Other obesity due to excess calories: Secondary | ICD-10-CM | POA: Diagnosis not present

## 2020-03-05 DIAGNOSIS — Z6838 Body mass index (BMI) 38.0-38.9, adult: Secondary | ICD-10-CM

## 2020-03-05 DIAGNOSIS — Z6837 Body mass index (BMI) 37.0-37.9, adult: Secondary | ICD-10-CM | POA: Diagnosis not present

## 2020-03-05 MED ORDER — PHENTERMINE HCL 15 MG PO CAPS: 15.0000 mg | ORAL_CAPSULE | ORAL | 0 refills | Status: DC

## 2020-03-05 MED ORDER — TOPIRAMATE 25 MG PO TABS: 25.0000 mg | ORAL_TABLET | Freq: Every evening | ORAL | 3 refills | Status: DC

## 2020-03-05 NOTE — Progress Notes (Signed)
[facility], [place] [address]  Internal MEDICINE  Office Visit Note  Patient Name: Miranda Briggs  [id number]  [id number]  Date of Service: 03/06/2020  Chief Complaint  Patient presents with  . Follow-up    Discuss weight loss  . Hypertension  . Gastroesophageal Reflux    HPI Patient is here for routine follow-up Followed for weight management Currently taking low dose phentermine and topamax Has lost one pound since our last visit Had a rough month with sick children--did not have time to focus on healthy eating and did not have time for exercise Now that everyone has recovered has recently started back on her healthy diet and exercising Denies any negative side effects from medications Sleeping well at night, no headaches, chest pain or palpitations BP well controlled today  Current Medication: Outpatient Encounter Medications as of 03/05/2020  Medication Sig  . acetaminophen (TYLENOL) 500 MG tablet Take 1,000 mg by mouth every 6 (six) hours as needed for moderate pain or headache.  . bisoprolol-hydrochlorothiazide (ZIAC) 5-6.25 MG tablet TAKE 1 TABLET BY MOUTH EVERY DAY  . levothyroxine (SYNTHROID) 50 MCG tablet Take 50 mcg by mouth daily before breakfast.  . Multiple Vitamin (MULTIVITAMIN WITH MINERALS) TABS tablet Take 1 tablet by mouth every evening.  . [DISCONTINUED] phentermine 15 MG capsule Take 1 capsule (15 mg total) by mouth every morning.  . [DISCONTINUED] topiramate (TOPAMAX) 25 MG tablet TAKE 1 TABLET BY MOUTH EVERY EVENING  . levothyroxine (SYNTHROID, LEVOTHROID) 50 MCG tablet Take 50 mcg by mouth daily.   . phentermine 15 MG capsule Take 1 capsule (15 mg total) by mouth every morning.  . topiramate (TOPAMAX) 25 MG tablet Take 1 tablet (25 mg total) by mouth every evening.  . [DISCONTINUED] azithromycin (ZITHROMAX) 250 MG tablet z-pack - take as directed for 5 days (Patient not taking: Reported on 03/05/2020)  . [DISCONTINUED]  nitrofurantoin, macrocrystal-monohydrate, (MACROBID) 100 MG capsule Take 1 capsule (100 mg total) by mouth 2 (two) times daily. (Patient not taking: Reported on 03/05/2020)   No facility-administered encounter medications on file as of 03/05/2020.    Surgical History: Past Surgical History:  Procedure Laterality Date  . BILATERAL SALPINGECTOMY Bilateral 12/11/2018   Procedure: BILATERAL SALPINGECTOMY;  Surgeon: Schermerhorn, Gwen Her, MD;  Location: [facility] ORS;  Service: Gynecology;  Laterality: Bilateral;  . CHOLECYSTECTOMY N/A 12/03/2015   Procedure: LAPAROSCOPIC CHOLECYSTECTOMY WITH INTRAOPERATIVE CHOLANGIOGRAM;  Surgeon: Christene Lye, MD;  Location: [facility] ORS;  Service: General;  Laterality: N/A;  . TONSILLECTOMY    . VAGINAL HYSTERECTOMY N/A 12/11/2018   Procedure: HYSTERECTOMY VAGINAL;  Surgeon: Schermerhorn, Gwen Her, MD;  Location: [facility] ORS;  Service: Gynecology;  Laterality: N/A;  . WISDOM TOOTH EXTRACTION      Medical History: Past Medical History:  Diagnosis Date  . GERD (gastroesophageal reflux disease)   . Hypertension   . Hypothyroidism   . Thyroid disease     Family History: Family History  Problem Relation Age of Onset  . Diabetes Father     Social History   Socioeconomic History  . Marital status: Married    Spouse name: Not on file  . Number of children: Not on file  . Years of education: Not on file  . Highest education level: Not on file  Occupational History  . Not on file  Tobacco Use  . Smoking status: Never Smoker  . Smokeless tobacco: Never Used  Vaping Use  . Vaping Use: Never used  Substance and Sexual  Activity  . Alcohol use: Yes    Comment: OCC WINE  . Drug use: No  . Sexual activity: Not on file  Other Topics Concern  . Not on file  Social History Narrative  . Not on file   Social Determinants of Health   Financial Resource Strain: Not on file  Food Insecurity: Not on file  Transportation Needs: Not on file  Physical  Activity: Not on file  Stress: Not on file  Social Connections: Not on file  Intimate Partner Violence: Not on file      Review of Systems  Constitutional: Negative for chills, diaphoresis and fatigue.  HENT: Negative for ear pain, postnasal drip and sinus pressure.   Eyes: Negative for photophobia, discharge, redness, itching and visual disturbance.  Respiratory: Negative for cough, shortness of breath and wheezing.   Cardiovascular: Negative for chest pain, palpitations and leg swelling.  Gastrointestinal: Negative for abdominal pain, constipation, diarrhea, nausea and vomiting.  Genitourinary: Negative for dysuria and flank pain.  Musculoskeletal: Negative for arthralgias, back pain, gait problem and neck pain.  Skin: Negative for color change.  Allergic/Immunologic: Negative for environmental allergies and food allergies.  Neurological: Negative for dizziness and headaches.  Hematological: Does not bruise/bleed easily.  Psychiatric/Behavioral: Negative for agitation, behavioral problems (depression) and hallucinations.    Vital Signs: BP 120/90   Pulse 70   Temp (!) 97.2 F (36.2 C)   Resp 16   Ht 5' 2.5" (1.588 m)   Wt 207 lb 3.2 oz (94 kg)   SpO2 98%   BMI 37.29 kg/m    Physical Exam Vitals reviewed.  Constitutional:      Appearance: Normal appearance. She is obese.  Cardiovascular:     Rate and Rhythm: Normal rate and regular rhythm.     Pulses: Normal pulses.     Heart sounds: Normal heart sounds.  Pulmonary:     Effort: Pulmonary effort is normal.     Breath sounds: Normal breath sounds.  Abdominal:     General: Abdomen is flat.     Palpations: Abdomen is soft.  Musculoskeletal:        General: Normal range of motion.     Cervical back: Normal range of motion.  Skin:    General: Skin is warm.  Neurological:     General: No focal deficit present.     Mental Status: She is alert and oriented to person, place, and time. Mental status is at baseline.   Psychiatric:        Mood and Affect: Mood normal.        Behavior: Behavior normal.        Thought Content: Thought content normal.        Judgment: Judgment normal.    Assessment/Plan: 1. Essential hypertension, benign BP and HR well controlled on current therapy, continue to monitor  2. Class 2 obesity due to excess calories without serious comorbidity with body mass index (BMI) of 37.0 to 37.9 in adult Continue with current regimen Encouraged to make healthy lifestyle modifications a priority to help with weight management Obesity Counseling: Risk Assessment: An assessment of behavioral risk factors was made today and includes lack of exercise sedentary lifestyle, lack of portion control and poor dietary habits.  Risk Modification Advice: She was counseled on portion control guidelines. Restricting daily caloric intake to 1800. The detrimental long term effects of obesity on her health and ongoing poor compliance was also discussed with the patient. - phentermine 15 MG capsule; Take  1 capsule (15 mg total) by mouth every morning.  Dispense: 30 capsule; Refill: 0 - topiramate (TOPAMAX) 25 MG tablet; Take 1 tablet (25 mg total) by mouth every evening.  Dispense: 30 tablet; Refill: 3  General Counseling: Sherlyne verbalizes understanding of the findings of todays visit and agrees with plan of treatment. I have discussed any further diagnostic evaluation that may be needed or ordered today. We also reviewed her medications today. she has been encouraged to call the office with any questions or concerns that should arise related to todays visit.   Meds ordered this encounter  Medications  . phentermine 15 MG capsule    Sig: Take 1 capsule (15 mg total) by mouth every morning.    Dispense:  30 capsule    Refill:  0  . topiramate (TOPAMAX) 25 MG tablet    Sig: Take 1 tablet (25 mg total) by mouth every evening.    Dispense:  30 tablet    Refill:  3    Time spent: 30 Minutes Time spent  includes review of chart, medications, test results and follow-up plan with the patient.  This patient was seen by Theodoro Grist AGNP-C in Collaboration with Dr Lavera Guise as a part of collaborative care agreement     Tanna Furry. Harris AGNP-C Internal medicine

## 2020-03-06 ENCOUNTER — Encounter: Payer: Self-pay | Admitting: Hospice and Palliative Medicine

## 2020-03-17 DIAGNOSIS — E669 Obesity, unspecified: Secondary | ICD-10-CM | POA: Diagnosis not present

## 2020-03-17 DIAGNOSIS — E039 Hypothyroidism, unspecified: Secondary | ICD-10-CM | POA: Diagnosis not present

## 2020-03-25 ENCOUNTER — Other Ambulatory Visit: Payer: Self-pay | Admitting: Nurse Practitioner

## 2020-03-25 DIAGNOSIS — I10 Essential (primary) hypertension: Secondary | ICD-10-CM

## 2020-04-02 ENCOUNTER — Other Ambulatory Visit: Payer: Self-pay

## 2020-04-02 ENCOUNTER — Ambulatory Visit (INDEPENDENT_AMBULATORY_CARE_PROVIDER_SITE_OTHER): Payer: BC Managed Care – PPO | Admitting: Hospice and Palliative Medicine

## 2020-04-02 ENCOUNTER — Encounter: Payer: Self-pay | Admitting: Hospice and Palliative Medicine

## 2020-04-02 VITALS — BP 134/90 | HR 75 | Temp 97.2°F | Resp 16 | Ht 63.0 in | Wt 211.8 lb

## 2020-04-02 DIAGNOSIS — E6609 Other obesity due to excess calories: Secondary | ICD-10-CM | POA: Diagnosis not present

## 2020-04-02 DIAGNOSIS — I10 Essential (primary) hypertension: Secondary | ICD-10-CM

## 2020-04-02 DIAGNOSIS — E782 Mixed hyperlipidemia: Secondary | ICD-10-CM | POA: Diagnosis not present

## 2020-04-02 DIAGNOSIS — R300 Dysuria: Secondary | ICD-10-CM | POA: Diagnosis not present

## 2020-04-02 DIAGNOSIS — Z0001 Encounter for general adult medical examination with abnormal findings: Secondary | ICD-10-CM

## 2020-04-02 DIAGNOSIS — Z6837 Body mass index (BMI) 37.0-37.9, adult: Secondary | ICD-10-CM | POA: Diagnosis not present

## 2020-04-02 MED ORDER — BISOPROLOL-HYDROCHLOROTHIAZIDE 5-6.25 MG PO TABS
1.0000 | ORAL_TABLET | Freq: Every day | ORAL | 1 refills | Status: DC
Start: 2020-04-02 — End: 2020-11-06

## 2020-04-02 MED ORDER — PHENTERMINE HCL 15 MG PO CAPS: 15.0000 mg | ORAL_CAPSULE | ORAL | 0 refills | Status: DC

## 2020-04-02 NOTE — Progress Notes (Signed)
[facility] [place], [place] [address]  Internal MEDICINE  Office Visit Note  Patient Name: Miranda Briggs  [id number]  [id number]  Date of Service: 04/03/2020  Chief Complaint  Patient presents with  . Annual Exam    Refill request  . Hypertension  . Gastroesophageal Reflux     HPI Pt is here for routine health maintenance examination Has had recent vaginal and breast exam with GYN S/p partial hysterectomy  Continues to be followed for weight management, has been on low dose phentermine as well as topiramate for a few months without much success She contributes her lack of motivation to eat healthy and exercise to this Denies negative side effects from medications, chest pain, palpitations, headaches or sleep disturbances  Reviewed most recent labs-abnormal lipid panel all others stable  Current Medication: Outpatient Encounter Medications as of 04/02/2020  Medication Sig  . acetaminophen (TYLENOL) 500 MG tablet Take 1,000 mg by mouth every 6 (six) hours as needed for moderate pain or headache.  . levothyroxine (SYNTHROID) 50 MCG tablet Take 50 mcg by mouth daily before breakfast.  . Multiple Vitamin (MULTIVITAMIN WITH MINERALS) TABS tablet Take 1 tablet by mouth every evening.  . topiramate (TOPAMAX) 25 MG tablet Take 1 tablet (25 mg total) by mouth every evening.  . [DISCONTINUED] bisoprolol-hydrochlorothiazide (ZIAC) 5-6.25 MG tablet TAKE 1 TABLET BY MOUTH EVERY DAY  . [DISCONTINUED] phentermine 15 MG capsule Take 1 capsule (15 mg total) by mouth every morning.  . bisoprolol-hydrochlorothiazide (ZIAC) 5-6.25 MG tablet Take 1 tablet by mouth daily.  . phentermine 15 MG capsule Take 1 capsule (15 mg total) by mouth every morning.  . [DISCONTINUED] levothyroxine (SYNTHROID, LEVOTHROID) 50 MCG tablet Take 50 mcg by mouth daily.    No facility-administered encounter medications on file as of 04/02/2020.    Surgical History: Past Surgical  History:  Procedure Laterality Date  . BILATERAL SALPINGECTOMY Bilateral 12/11/2018   Procedure: BILATERAL SALPINGECTOMY;  Surgeon: Schermerhorn, Gwen Her, MD;  Location: [facility] OR[facility];  Service: Gynecology;  Laterality: Bilateral;  . CHOLECYSTECTOMY N/A 12/03/2015   Procedure: LAPAROSCOPIC CHOLECYSTECTOMY WITH INTRAOPERATIVE CHOLANGIOGRAM;  Surgeon: Christene Lye, MD;  Location: [facility];  Service: General;  Laterality: N/A;  . TONSILLECTOMY    . VAGINAL HYSTERECTOMY N/A 12/11/2018   Procedure: HYSTERECTOMY VAGINAL;  Surgeon: Schermerhorn, Gwen Her, MD;  Location: [facility] OR[facility];  Service: Gynecology;  Laterality: N/A;  . WISDOM TOOTH EXTRACTION      Medical History: Past Medical History:  Diagnosis Date  . GERD (gastroesophageal reflux disease)   . Hypertension   . Hypothyroidism   . Thyroid disease     Family History: Family History  Problem Relation Age of Onset  . Diabetes Father       Review of Systems  Constitutional: Negative for chills, diaphoresis and fatigue.  HENT: Negative for ear pain, postnasal drip and sinus pressure.   Eyes: Negative for photophobia, discharge, redness, itching and visual disturbance.  Respiratory: Negative for cough, shortness of breath and wheezing.   Cardiovascular: Negative for chest pain, palpitations and leg swelling.  Gastrointestinal: Negative for abdominal pain, constipation, diarrhea, nausea and vomiting.  Genitourinary: Negative for dysuria and flank pain.  Musculoskeletal: Negative for arthralgias, back pain, gait problem and neck pain.  Skin: Negative for color change.  Allergic/Immunologic: Negative for environmental allergies and food allergies.  Neurological: Negative for dizziness and headaches.  Hematological: Does not bruise/bleed easily.  Psychiatric/Behavioral: Negative for agitation, behavioral problems (depression) and hallucinations.  Vital Signs: BP 134/90   Pulse 75   Temp (!) 97.2 F (36.2 C)   Resp 16   Ht  5\' 3"  (1.6 m)   Wt 211 lb 12.8 oz (96.1 kg)   SpO2 98%   BMI 37.52 kg/m    Physical Exam Vitals reviewed.  Constitutional:      Appearance: Normal appearance. She is obese.  Cardiovascular:     Rate and Rhythm: Normal rate and regular rhythm.     Pulses: Normal pulses.     Heart sounds: Normal heart sounds.  Pulmonary:     Effort: Pulmonary effort is normal.     Breath sounds: Normal breath sounds.  Abdominal:     General: Abdomen is flat.     Palpations: Abdomen is soft.  Musculoskeletal:        General: Normal range of motion.     Cervical back: Normal range of motion.  Skin:    General: Skin is warm.  Neurological:     General: No focal deficit present.     Mental Status: She is alert and oriented to person, place, and time. Mental status is at baseline.  Psychiatric:        Mood and Affect: Mood normal.        Behavior: Behavior normal.        Thought Content: Thought content normal.        Judgment: Judgment normal.      LABS: Recent Results (from the past 2160 hour(s))  CULTURE, URINE COMPREHENSIVE     Status: None   Collection Time: 02/04/20  4:34 PM   Specimen: Urine   Urine  Result Value Ref Range   Urine Culture, Comprehensive Final report    Organism ID, Bacteria Comment     Comment: Mixed urogenital flora 10,000-25,000 colony forming units per mL   POCT Urinalysis Dipstick     Status: Normal   Collection Time: 02/04/20  4:35 PM  Result Value Ref Range   Color, UA     Clarity, UA     Glucose, UA Negative Negative   Bilirubin, UA neg    Ketones, UA neg    Spec Grav, UA 1.010 1.010 - 1.025   Blood, UA neg    pH, UA 6.5 5.0 - 8.0   Protein, UA Negative Negative   Urobilinogen, UA 0.2 0.2 or 1.0 E.U./dL   Nitrite, UA neg    Leukocytes, UA Negative Negative   Appearance     Odor    UA/M w/rflx Culture, Routine     Status: None   Collection Time: 04/02/20 11:07 AM   Specimen: Urine   Urine  Result Value Ref Range   Specific Gravity, UA  1.009 1.005 - 1.030   pH, UA 7.0 5.0 - 7.5   Color, UA Yellow Yellow   Appearance Ur Clear Clear   Leukocytes,UA Negative Negative   Protein,UA Negative Negative/Trace   Glucose, UA Negative Negative   Ketones, UA Negative Negative   RBC, UA Negative Negative   Bilirubin, UA Negative Negative   Urobilinogen, Ur 0.2 0.2 - 1.0 mg/dL   Nitrite, UA Negative Negative   Microscopic Examination Comment     Comment: Microscopic follows if indicated.   Microscopic Examination See below:     Comment: Microscopic was indicated and was performed.   Urinalysis Reflex Comment     Comment: This specimen will not reflex to a Urine Culture.  Microscopic Examination     Status: None  Collection Time: 04/02/20 11:07 AM   Urine  Result Value Ref Range   WBC, UA None seen 0 - 5 /hpf   RBC None seen 0 - 2 /hpf   Epithelial Cells (non renal) None seen 0 - 10 /hpf   Casts None seen None seen /lpf   Bacteria, UA None seen None seen/Few   Assessment/Plan: 1. Encounter for routine adult health examination with abnormal findings Well appearing 30 year old female Labs reviewed Up to date on age appropriate PHM  2. Essential hypertension, benign BP and HR well controlled today, requesting refills - bisoprolol-hydrochlorothiazide (ZIAC) 5-6.25 MG tablet; Take 1 tablet by mouth daily.  Dispense: 90 tablet; Refill: 1  3. Mixed hyperlipidemia Discussed elevation in total and LDL levels, encouraged to avoid fried and fatty food and increase activity levels  4. Class 2 obesity due to excess calories without serious comorbidity with body mass index (BMI) of 37.0 to 37.9 in adult Comorbid conditions include HTN and HLD Continue with topamax as well as phentermine--will need to have weight loss prior to next visit for further refills of phentermine [place] Controlled Substance Database was reviewed by me for overdose risk score (ORS) Obesity Counseling: Risk Assessment: An assessment of behavioral risk factors  was made today and includes lack of exercise sedentary lifestyle, lack of portion control and poor dietary habits.  Risk Modification Advice: She was counseled on portion control guidelines. Restricting daily caloric intake to 1800. The detrimental long term effects of obesity on her health and ongoing poor compliance was also discussed with the patient. - phentermine 15 MG capsule; Take 1 capsule (15 mg total) by mouth every morning.  Dispense: 30 capsule; Refill: 0  5. Dysuria - UA/M w/rflx Culture, Routine - Microscopic Examination  General Counseling: Miranda Briggs verbalizes understanding of the findings of todays visit and agrees with plan of treatment. I have discussed any further diagnostic evaluation that may be needed or ordered today. We also reviewed her medications today. she has been encouraged to call the office with any questions or concerns that should arise related to todays visit.    Counseling:    Orders Placed This Encounter  Procedures  . Microscopic Examination  . UA/M w/rflx Culture, Routine    Meds ordered this encounter  Medications  . phentermine 15 MG capsule    Sig: Take 1 capsule (15 mg total) by mouth every morning.    Dispense:  30 capsule    Refill:  0  . bisoprolol-hydrochlorothiazide (ZIAC) 5-6.25 MG tablet    Sig: Take 1 tablet by mouth daily.    Dispense:  90 tablet    Refill:  1    Total time spent: 30 Minutes  Time spent includes review of chart, medications, test results, and follow up plan with the patient.   This patient was seen by Theodoro Grist AGNP-C Collaboration with Dr Lavera Guise as a part of collaborative care agreement   Tanna Furry. [facility] Internal Medicine

## 2020-04-03 ENCOUNTER — Encounter: Payer: Self-pay | Admitting: Hospice and Palliative Medicine

## 2020-04-03 LAB — UA/M W/RFLX CULTURE, ROUTINE
Bilirubin, UA: NEGATIVE
Glucose, UA: NEGATIVE
Ketones, UA: NEGATIVE
Leukocytes,UA: NEGATIVE
Nitrite, UA: NEGATIVE
Protein,UA: NEGATIVE
RBC, UA: NEGATIVE
Specific Gravity, UA: 1.009 (ref 1.005–1.030)
Urobilinogen, Ur: 0.2 mg/dL (ref 0.2–1.0)
pH, UA: 7 (ref 5.0–7.5)

## 2020-04-03 LAB — MICROSCOPIC EXAMINATION
Bacteria, UA: NONE SEEN
Casts: NONE SEEN /lpf
Epithelial Cells (non renal): NONE SEEN /hpf (ref 0–10)
RBC, Urine: NONE SEEN /hpf (ref 0–2)
WBC, UA: NONE SEEN /hpf (ref 0–5)

## 2020-04-30 ENCOUNTER — Encounter: Payer: Self-pay | Admitting: Hospice and Palliative Medicine

## 2020-04-30 ENCOUNTER — Ambulatory Visit: Payer: BC Managed Care – PPO | Admitting: Hospice and Palliative Medicine

## 2020-04-30 VITALS — BP 126/88 | HR 78 | Temp 97.2°F | Resp 16 | Ht 62.5 in | Wt 211.0 lb

## 2020-04-30 DIAGNOSIS — E6609 Other obesity due to excess calories: Secondary | ICD-10-CM | POA: Diagnosis not present

## 2020-04-30 DIAGNOSIS — Z6837 Body mass index (BMI) 37.0-37.9, adult: Secondary | ICD-10-CM

## 2020-04-30 DIAGNOSIS — I10 Essential (primary) hypertension: Secondary | ICD-10-CM | POA: Diagnosis not present

## 2020-04-30 MED ORDER — TOPIRAMATE 25 MG PO TABS: 25.0000 mg | ORAL_TABLET | Freq: Two times a day (BID) | ORAL | 1 refills | Status: DC

## 2020-04-30 MED ORDER — PHENTERMINE HCL 15 MG PO CAPS: 15.0000 mg | ORAL_CAPSULE | ORAL | 0 refills | Status: DC

## 2020-04-30 NOTE — Progress Notes (Signed)
[facility] [place], [place] [address]  Internal MEDICINE  Office Visit Note   Patient Name: Miranda Briggs  [id number]  [id number]  Date of Service: 05/07/2020  Chief Complaint  Patient presents with  . Follow-up    Discuss weight loss  . Gastroesophageal Reflux  . Hypertension    HPI Patient is here for routine follow-up Followed for weight loss Currently taking low dose Phentermine as well as Topamax No change in weight since her last visit Struggles with balancing work and being a mother and finding time to meal plan and exercise  Denies negative side effects from medications such as chest pain, palpitations or insomnia  Current Medication: Outpatient Encounter Medications as of 04/30/2020  Medication Sig  . acetaminophen (TYLENOL) 500 MG tablet Take 1,000 mg by mouth every 6 (six) hours as needed for moderate pain or headache.  . bisoprolol-hydrochlorothiazide (ZIAC) 5-6.25 MG tablet Take 1 tablet by mouth daily.  Marland Kitchen levothyroxine (SYNTHROID) 50 MCG tablet Take 50 mcg by mouth daily before breakfast.  . Multiple Vitamin (MULTIVITAMIN WITH MINERALS) TABS tablet Take 1 tablet by mouth every evening.  . topiramate (TOPAMAX) 25 MG tablet Take 1 tablet (25 mg total) by mouth 2 (two) times daily.  . [DISCONTINUED] phentermine 15 MG capsule Take 1 capsule (15 mg total) by mouth every morning.  . [DISCONTINUED] topiramate (TOPAMAX) 25 MG tablet Take 1 tablet (25 mg total) by mouth every evening.  . phentermine 15 MG capsule Take 1 capsule (15 mg total) by mouth every morning.   No facility-administered encounter medications on file as of 04/30/2020.    Surgical History: Past Surgical History:  Procedure Laterality Date  . BILATERAL SALPINGECTOMY Bilateral 12/11/2018   Procedure: BILATERAL SALPINGECTOMY;  Surgeon: Schermerhorn, Gwen Her, MD;  Location: [facility];  Service: Gynecology;  Laterality: Bilateral;  . CHOLECYSTECTOMY N/A 12/03/2015    Procedure: LAPAROSCOPIC CHOLECYSTECTOMY WITH INTRAOPERATIVE CHOLANGIOGRAM;  Surgeon: Christene Lye, MD;  Location: [facility];  Service: General;  Laterality: N/A;  . TONSILLECTOMY    . VAGINAL HYSTERECTOMY N/A 12/11/2018   Procedure: HYSTERECTOMY VAGINAL;  Surgeon: Schermerhorn, Gwen Her, MD;  Location: [facility];  Service: Gynecology;  Laterality: N/A;  . WISDOM TOOTH EXTRACTION      Medical History: Past Medical History:  Diagnosis Date  . GERD (gastroesophageal reflux disease)   . Hypertension   . Hypothyroidism   . Thyroid disease     Family History: Family History  Problem Relation Age of Onset  . Diabetes Father   . Hyperthyroidism Father   . Hypertension Mother     Social History   Socioeconomic History  . Marital status: Married    Spouse name: Not on file  . Number of children: Not on file  . Years of education: Not on file  . Highest education level: Not on file  Occupational History  . Not on file  Tobacco Use  . Smoking status: Never Smoker  . Smokeless tobacco: Never Used  Vaping Use  . Vaping Use: Never used  Substance and Sexual Activity  . Alcohol use: Yes    Comment: OCC WINE  . Drug use: No  . Sexual activity: Not on file  Other Topics Concern  . Not on file  Social History Narrative  . Not on file   Social Determinants of Health   Financial Resource Strain: Not on file  Food Insecurity: Not on file  Transportation Needs: Not on file  Physical Activity: Not on file  Stress: Not on file  Social Connections: Not on file  Intimate Partner Violence: Not on file      Review of Systems  Constitutional: Negative for chills, diaphoresis and fatigue.  HENT: Negative for ear pain, postnasal drip and sinus pressure.   Eyes: Negative for photophobia, discharge, redness, itching and visual disturbance.  Respiratory: Negative for cough, shortness of breath and wheezing.   Cardiovascular: Negative for chest pain, palpitations and leg  swelling.  Gastrointestinal: Negative for abdominal pain, constipation, diarrhea, nausea and vomiting.  Genitourinary: Negative for dysuria and flank pain.  Musculoskeletal: Negative for arthralgias, back pain, gait problem and neck pain.  Skin: Negative for color change.  Allergic/Immunologic: Negative for environmental allergies and food allergies.  Neurological: Negative for dizziness and headaches.  Hematological: Does not bruise/bleed easily.  Psychiatric/Behavioral: Negative for agitation, behavioral problems (depression) and hallucinations.    Vital Signs: BP 126/88   Pulse 78   Temp (!) 97.2 F (36.2 C)   Resp 16   Ht 5' 2.5" (1.588 m)   Wt 211 lb (95.7 kg)   SpO2 97%   BMI 37.98 kg/m    Physical Exam Vitals reviewed.  Constitutional:      Appearance: Normal appearance. She is obese.  Cardiovascular:     Rate and Rhythm: Normal rate and regular rhythm.     Pulses: Normal pulses.     Heart sounds: Normal heart sounds.  Pulmonary:     Effort: Pulmonary effort is normal.     Breath sounds: Normal breath sounds.  Abdominal:     General: Abdomen is flat.     Palpations: Abdomen is soft.  Musculoskeletal:        General: Normal range of motion.     Cervical back: Normal range of motion.  Skin:    General: Skin is warm.  Neurological:     General: No focal deficit present.     Mental Status: She is alert and oriented to person, place, and time. Mental status is at baseline.  Psychiatric:        Mood and Affect: Mood normal.        Behavior: Behavior normal.        Thought Content: Thought content normal.        Judgment: Judgment normal.    Assessment/Plan: 1. Class 2 obesity due to excess calories without serious comorbidity with body mass index (BMI) of 37.0 to 37.9 in adult Increase frequency of Topamax to BID, continue with current low dose Phentermine Obesity Counseling: Risk Assessment: An assessment of behavioral risk factors was made today and  includes lack of exercise sedentary lifestyle, lack of portion control and poor dietary habits.  Risk Modification Advice: She was counseled on portion control guidelines. Restricting daily caloric intake to 1600. The detrimental long term effects of obesity on her health and ongoing poor compliance was also discussed with the patient. - topiramate (TOPAMAX) 25 MG tablet; Take 1 tablet (25 mg total) by mouth 2 (two) times daily.  Dispense: 60 tablet; Refill: 1 - phentermine 15 MG capsule; Take 1 capsule (15 mg total) by mouth every morning.  Dispense: 30 capsule; Refill: 0  2. Essential hypertension, benign BP and HR remains well controlled on present management, continue to monitor  General Counseling: Annisha verbalizes understanding of the findings of todays visit and agrees with plan of treatment. I have discussed any further diagnostic evaluation that may be needed or ordered today. We also reviewed her medications today. she has been encouraged  to call the office with any questions or concerns that should arise related to todays visit.  Meds ordered this encounter  Medications  . topiramate (TOPAMAX) 25 MG tablet    Sig: Take 1 tablet (25 mg total) by mouth 2 (two) times daily.    Dispense:  60 tablet    Refill:  1  . phentermine 15 MG capsule    Sig: Take 1 capsule (15 mg total) by mouth every morning.    Dispense:  30 capsule    Refill:  0    Time spent:30 Minutes Time spent includes review of chart, medications, test results and follow-up plan with the patient.  This patient was seen by Theodoro Grist AGNP-C in Collaboration with Dr Lavera Guise as a part of collaborative care agreement     Tanna Furry. Izzabella Besse AGNP-C Internal medicine

## 2020-05-07 ENCOUNTER — Encounter: Payer: Self-pay | Admitting: Hospice and Palliative Medicine

## 2020-05-29 ENCOUNTER — Encounter: Payer: Self-pay | Admitting: Physician Assistant

## 2020-05-29 ENCOUNTER — Ambulatory Visit: Payer: BC Managed Care – PPO | Admitting: Physician Assistant

## 2020-05-29 DIAGNOSIS — E6609 Other obesity due to excess calories: Secondary | ICD-10-CM | POA: Diagnosis not present

## 2020-05-29 DIAGNOSIS — Z6837 Body mass index (BMI) 37.0-37.9, adult: Secondary | ICD-10-CM | POA: Diagnosis not present

## 2020-05-29 DIAGNOSIS — I10 Essential (primary) hypertension: Secondary | ICD-10-CM

## 2020-05-29 DIAGNOSIS — U071 COVID-19: Secondary | ICD-10-CM | POA: Diagnosis not present

## 2020-05-29 MED ORDER — PHENTERMINE HCL 15 MG PO CAPS: 15.0000 mg | ORAL_CAPSULE | ORAL | 0 refills | Status: DC

## 2020-05-29 NOTE — Progress Notes (Signed)
[facility] [address], [place] [address]  Internal MEDICINE  Telephone Visit  Patient Name: Miranda Briggs  [id number]  [id number]  Date of Service: 05/29/2020  I connected with the patient at 10:31 by telephone and verified the patients identity using two identifiers.   I discussed the limitations, risks, security and privacy concerns of performing an evaluation and management service by telephone and the availability of in person appointments. I also discussed with the patient that there may be a patient responsible charge related to the service.  The patient expressed understanding and agrees to proceed.    Chief Complaint  Patient presents with  . Telephone Assessment  . Telephone Screen  . Follow-up  . Covid Positive    HPI Pt is here for virtual follow up visit due to covid positive -Symptoms started with sore throat on Tuesday. Worsened with cough and hoarseness yesterday so took test. Started mucinex yesterday. Lots of head congestion. Fever yesterday but not today. Chills resolved. Denies SOB or wheezing.  -Vaccinated x2 but no booster. No pulmonary hx or smoking hx. Recommended flonase, warm tea with honey.  -Wt at home: 207.8 this AM, taking topamax BID and on low dose phentermine. Discussed 1 more month of combo then consider pause/d/c of phentermine. -BP at home 122/84 at home recently.  Current Medication: Outpatient Encounter Medications as of 05/29/2020  Medication Sig  . acetaminophen (TYLENOL) 500 MG tablet Take 1,000 mg by mouth every 6 (six) hours as needed for moderate pain or headache.  . bisoprolol-hydrochlorothiazide (ZIAC) 5-6.25 MG tablet Take 1 tablet by mouth daily.  Marland Kitchen levothyroxine (SYNTHROID) 50 MCG tablet Take 50 mcg by mouth daily before breakfast.  . Multiple Vitamin (MULTIVITAMIN WITH MINERALS) TABS tablet Take 1 tablet by mouth every evening.  . topiramate (TOPAMAX) 25 MG tablet Take 1 tablet (25 mg total) by mouth 2 (two)  times daily.  . [DISCONTINUED] phentermine 15 MG capsule Take 1 capsule (15 mg total) by mouth every morning.  . phentermine 15 MG capsule Take 1 capsule (15 mg total) by mouth every morning.   No facility-administered encounter medications on file as of 05/29/2020.    Surgical History: Past Surgical History:  Procedure Laterality Date  . BILATERAL SALPINGECTOMY Bilateral 12/11/2018   Procedure: BILATERAL SALPINGECTOMY;  Surgeon: Schermerhorn, Gwen Her, MD;  Location: [facility];  Service: Gynecology;  Laterality: Bilateral;  . CHOLECYSTECTOMY N/A 12/03/2015   Procedure: LAPAROSCOPIC CHOLECYSTECTOMY WITH INTRAOPERATIVE CHOLANGIOGRAM;  Surgeon: Christene Lye, MD;  Location: [facility];  Service: General;  Laterality: N/A;  . TONSILLECTOMY    . VAGINAL HYSTERECTOMY N/A 12/11/2018   Procedure: HYSTERECTOMY VAGINAL;  Surgeon: Schermerhorn, Gwen Her, MD;  Location: [facility];  Service: Gynecology;  Laterality: N/A;  . WISDOM TOOTH EXTRACTION      Medical History: Past Medical History:  Diagnosis Date  . GERD (gastroesophageal reflux disease)   . Hypertension   . Hypothyroidism   . Thyroid disease     Family History: Family History  Problem Relation Age of Onset  . Diabetes Father   . Hyperthyroidism Father   . Hypertension Mother     Social History   Socioeconomic History  . Marital status: Married    Spouse name: Not on file  . Number of children: Not on file  . Years of education: Not on file  . Highest education level: Not on file  Occupational History  . Not on file  Tobacco Use  . Smoking status: Never Smoker  .  Smokeless tobacco: Never Used  Vaping Use  . Vaping Use: Never used  Substance and Sexual Activity  . Alcohol use: Yes    Comment: OCC WINE  . Drug use: No  . Sexual activity: Not on file  Other Topics Concern  . Not on file  Social History Narrative  . Not on file   Social Determinants of Health   Financial Resource Strain: Not on file  Food  Insecurity: Not on file  Transportation Needs: Not on file  Physical Activity: Not on file  Stress: Not on file  Social Connections: Not on file  Intimate Partner Violence: Not on file      Review of Systems  Constitutional: Positive for fatigue. Negative for unexpected weight change.  HENT: Positive for congestion, postnasal drip, sore throat and voice change. Negative for rhinorrhea and sneezing.   Eyes: Negative for redness.  Respiratory: Positive for cough. Negative for chest tightness, shortness of breath and wheezing.   Cardiovascular: Negative for chest pain and palpitations.  Gastrointestinal: Negative for abdominal pain, constipation, diarrhea, nausea and vomiting.  Genitourinary: Negative for dysuria and frequency.  Musculoskeletal: Negative for arthralgias, back pain, joint swelling and neck pain.  Skin: Negative for rash.  Neurological: Negative.  Negative for tremors and numbness.  Hematological: Negative for adenopathy. Does not bruise/bleed easily.  Psychiatric/Behavioral: Negative for behavioral problems (Depression), sleep disturbance and suicidal ideas. The patient is not nervous/anxious.     Vital Signs: BP (!) 128/98   Temp 97.7 F (36.5 C)    Observation/Objective:  Pt is able to carry out converrsation   Assessment/Plan: 1. COVID-19 virus infection Advised to continue mucinex, start flonase and warm tea with honey for sore throat. Patient advised to contact office or go to ED if acute worsening. Educated to stay well hydrated and rest.  2. Essential hypertension, benign Stable, continue current therapy  3. Class 2 obesity due to excess calories without serious comorbidity with body mass index (BMI) of 37.0 to 37.9 in adult Will continue topamax BID with low dose phentermine, discussed pausing or d/c phentermine at next visit. Continue to improve diet and exercise. - phentermine 15 MG capsule; Take 1 capsule (15 mg total) by mouth every morning.   Dispense: 30 capsule; Refill: 0   General Counseling: Georgene verbalizes understanding of the findings of today's phone visit and agrees with plan of treatment. I have discussed any further diagnostic evaluation that may be needed or ordered today. We also reviewed her medications today. she has been encouraged to call the office with any questions or concerns that should arise related to todays visit.    No orders of the defined types were placed in this encounter.   Meds ordered this encounter  Medications  . phentermine 15 MG capsule    Sig: Take 1 capsule (15 mg total) by mouth every morning.    Dispense:  30 capsule    Refill:  0    Time spent:30 Minutes    Dr Lavera Guise Internal medicine

## 2020-06-04 ENCOUNTER — Other Ambulatory Visit: Payer: Self-pay

## 2020-06-04 DIAGNOSIS — Z6837 Body mass index (BMI) 37.0-37.9, adult: Secondary | ICD-10-CM

## 2020-06-04 DIAGNOSIS — E6609 Other obesity due to excess calories: Secondary | ICD-10-CM

## 2020-06-04 MED ORDER — TOPIRAMATE 25 MG PO TABS: 25.0000 mg | ORAL_TABLET | Freq: Two times a day (BID) | ORAL | 1 refills | Status: DC

## 2020-06-26 ENCOUNTER — Other Ambulatory Visit: Payer: Self-pay

## 2020-06-26 DIAGNOSIS — E6609 Other obesity due to excess calories: Secondary | ICD-10-CM

## 2020-06-26 MED ORDER — TOPIRAMATE 25 MG PO TABS: 25.0000 mg | ORAL_TABLET | Freq: Two times a day (BID) | ORAL | 0 refills | Status: DC

## 2020-07-03 ENCOUNTER — Other Ambulatory Visit: Payer: Self-pay | Admitting: Internal Medicine

## 2020-07-03 DIAGNOSIS — E6609 Other obesity due to excess calories: Secondary | ICD-10-CM

## 2020-07-25 ENCOUNTER — Other Ambulatory Visit: Payer: Self-pay | Admitting: Internal Medicine

## 2020-07-25 DIAGNOSIS — E6609 Other obesity due to excess calories: Secondary | ICD-10-CM

## 2020-07-25 DIAGNOSIS — Z6837 Body mass index (BMI) 37.0-37.9, adult: Secondary | ICD-10-CM

## 2020-08-12 ENCOUNTER — Other Ambulatory Visit: Payer: Self-pay | Admitting: Internal Medicine

## 2020-08-12 DIAGNOSIS — E6609 Other obesity due to excess calories: Secondary | ICD-10-CM

## 2020-09-16 ENCOUNTER — Ambulatory Visit: Payer: BC Managed Care – PPO | Admitting: Nurse Practitioner

## 2020-09-16 DIAGNOSIS — Z0289 Encounter for other administrative examinations: Secondary | ICD-10-CM

## 2020-10-09 ENCOUNTER — Encounter: Payer: Self-pay | Admitting: Nurse Practitioner

## 2020-10-09 ENCOUNTER — Other Ambulatory Visit: Payer: Self-pay

## 2020-10-09 ENCOUNTER — Ambulatory Visit: Payer: BC Managed Care – PPO | Admitting: Nurse Practitioner

## 2020-10-09 VITALS — BP 126/81 | HR 90 | Temp 97.8°F | Resp 16 | Ht 63.0 in | Wt 216.0 lb

## 2020-10-09 DIAGNOSIS — E6609 Other obesity due to excess calories: Secondary | ICD-10-CM | POA: Diagnosis not present

## 2020-10-09 DIAGNOSIS — Z6837 Body mass index (BMI) 37.0-37.9, adult: Secondary | ICD-10-CM | POA: Diagnosis not present

## 2020-10-09 DIAGNOSIS — E782 Mixed hyperlipidemia: Secondary | ICD-10-CM

## 2020-10-09 DIAGNOSIS — I10 Essential (primary) hypertension: Secondary | ICD-10-CM

## 2020-10-09 NOTE — Progress Notes (Signed)
[facility] [place], [place] [address]  Internal MEDICINE  Office Visit Note  Patient Name: Miranda Briggs  [id number]  [id number]  Date of Service: 10/09/2020  Chief Complaint  Patient presents with  . Follow-up  . Hypertension    HPI Miranda Briggs presents for a follow up visit regarding hypertension and weight loss. She is no longer taking phentermine for weight loss. She had covid back in May 2022 and had a virtual visit. When checking her blood pressure at home, her diastolic blood pressure was elevated at 98. Her blood pressure today is appropriate at 126/81.     Current Medication: Outpatient Encounter Medications as of 10/09/2020  Medication Sig  . acetaminophen (TYLENOL) 500 MG tablet Take 1,000 mg by mouth every 6 (six) hours as needed for moderate pain or headache.  . bisoprolol-hydrochlorothiazide (ZIAC) 5-6.25 MG tablet Take 1 tablet by mouth daily.  Marland Kitchen levothyroxine (SYNTHROID) 50 MCG tablet Take 50 mcg by mouth daily before breakfast.  . Multiple Vitamin (MULTIVITAMIN WITH MINERALS) TABS tablet Take 1 tablet by mouth every evening.  . topiramate (TOPAMAX) 25 MG tablet TAKE 1 TABLET BY MOUTH TWICE A DAY  . [DISCONTINUED] phentermine 15 MG capsule Take 1 capsule (15 mg total) by mouth every morning.   No facility-administered encounter medications on file as of 10/09/2020.    Surgical History: Past Surgical History:  Procedure Laterality Date  . BILATERAL SALPINGECTOMY Bilateral 12/11/2018   Procedure: BILATERAL SALPINGECTOMY;  Surgeon: Schermerhorn, Gwen Her, MD;  Location: [facility];  Service: Gynecology;  Laterality: Bilateral;  . CHOLECYSTECTOMY N/A 12/03/2015   Procedure: LAPAROSCOPIC CHOLECYSTECTOMY WITH INTRAOPERATIVE CHOLANGIOGRAM;  Surgeon: Christene Lye, MD;  Location: [facility];  Service: General;  Laterality: N/A;  . TONSILLECTOMY    . VAGINAL HYSTERECTOMY N/A 12/11/2018   Procedure: HYSTERECTOMY VAGINAL;  Surgeon: Schermerhorn,  Gwen Her, MD;  Location: [facility];  Service: Gynecology;  Laterality: N/A;  . WISDOM TOOTH EXTRACTION      Medical History: Past Medical History:  Diagnosis Date  . GERD (gastroesophageal reflux disease)   . Hypertension   . Hypothyroidism   . Thyroid disease     Family History: Family History  Problem Relation Age of Onset  . Diabetes Father   . Hyperthyroidism Father   . Hypertension Mother     Social History   Socioeconomic History  . Marital status: Married    Spouse name: Not on file  . Number of children: Not on file  . Years of education: Not on file  . Highest education level: Not on file  Occupational History  . Not on file  Tobacco Use  . Smoking status: Never  . Smokeless tobacco: Never  Vaping Use  . Vaping Use: Never used  Substance and Sexual Activity  . Alcohol use: Yes    Comment: [facility] WINE  . Drug use: No  . Sexual activity: Not on file  Other Topics Concern  . Not on file  Social History Narrative  . Not on file   Social Determinants of Health   Financial Resource Strain: Not on file  Food Insecurity: Not on file  Transportation Needs: Not on file  Physical Activity: Not on file  Stress: Not on file  Social Connections: Not on file  Intimate Partner Violence: Not on file      Review of Systems  Constitutional:  Negative for chills, fatigue and unexpected weight change.  HENT:  Negative for congestion, rhinorrhea, sneezing and sore throat.  Eyes:  Negative for redness.  Respiratory:  Negative for cough, chest tightness and shortness of breath.   Cardiovascular:  Negative for chest pain and palpitations.  Gastrointestinal:  Negative for abdominal pain, constipation, diarrhea, nausea and vomiting.  Genitourinary:  Negative for dysuria and frequency.  Musculoskeletal:  Negative for arthralgias, back pain, joint swelling and neck pain.  Skin:  Negative for rash.  Neurological: Negative.  Negative for tremors and numbness.   Hematological:  Negative for adenopathy. Does not bruise/bleed easily.  Psychiatric/Behavioral:  Negative for behavioral problems (Depression), sleep disturbance and suicidal ideas. The patient is not nervous/anxious.    Vital Signs: BP 126/81   Pulse 90   Temp 97.8 F (36.6 C)   Resp 16   Ht 5\' 3"  (1.6 m)   Wt 216 lb (98 kg)   SpO2 95%   BMI 38.26 kg/m    Physical Exam Vitals reviewed.  Constitutional:      General: She is not in acute distress.    Appearance: Normal appearance. She is obese. She is not ill-appearing.  HENT:     Head: Normocephalic and atraumatic.  Eyes:     Extraocular Movements: Extraocular movements intact.     Pupils: Pupils are equal, round, and reactive to light.  Cardiovascular:     Rate and Rhythm: Normal rate and regular rhythm.  Pulmonary:     Effort: Pulmonary effort is normal. No respiratory distress.  Neurological:     Mental Status: She is alert and oriented to person, place, and time.     Cranial Nerves: No cranial nerve deficit.     Coordination: Coordination normal.     Gait: Gait normal.  Psychiatric:        Mood and Affect: Mood normal.        Behavior: Behavior normal.       Assessment/Plan: 1. Essential hypertension, benign Blood pressure is stable with current medications, continue as prescribed.   2. Class 2 obesity due to excess calories without serious comorbidity with body mass index (BMI) of 37.0 to 37.9 in adult Was taking phentermine to aid in weight loss. She has an upcoming office visit with Lauren PA-C to discuss weight loss. She wants to hold off on discussing other options until she sees Lauren.    General Counseling: Miranda Briggs verbalizes understanding of the findings of todays visit and agrees with plan of treatment. I have discussed any further diagnostic evaluation that may be needed or ordered today. We also reviewed her medications today. she has been encouraged to call the office with any questions or concerns  that should arise related to todays visit.    No orders of the defined types were placed in this encounter.   No orders of the defined types were placed in this encounter.   Return in about 4 weeks (around 11/06/2020) for F/U, Weight loss with Lauren PA-C.   Total time spent:20 Minutes Time spent includes review of chart, medications, test results, and follow up plan with the patient.   [place] Controlled Substance Database was reviewed by me.  This patient was seen by Jonetta Osgood, FNP-C in collaboration with Dr. Clayborn Bigness as a part of collaborative care agreement.   Gust Eugene R. Valetta Fuller, MSN, FNP-C Internal medicine

## 2020-10-26 ENCOUNTER — Encounter: Payer: Self-pay | Admitting: Nurse Practitioner

## 2020-11-06 ENCOUNTER — Other Ambulatory Visit: Payer: Self-pay

## 2020-11-06 ENCOUNTER — Encounter: Payer: Self-pay | Admitting: Physician Assistant

## 2020-11-06 ENCOUNTER — Ambulatory Visit: Payer: BC Managed Care – PPO | Admitting: Physician Assistant

## 2020-11-06 DIAGNOSIS — I10 Essential (primary) hypertension: Secondary | ICD-10-CM | POA: Diagnosis not present

## 2020-11-06 DIAGNOSIS — Z6837 Body mass index (BMI) 37.0-37.9, adult: Secondary | ICD-10-CM | POA: Diagnosis not present

## 2020-11-06 DIAGNOSIS — R7301 Impaired fasting glucose: Secondary | ICD-10-CM | POA: Diagnosis not present

## 2020-11-06 DIAGNOSIS — E6609 Other obesity due to excess calories: Secondary | ICD-10-CM

## 2020-11-06 DIAGNOSIS — E039 Hypothyroidism, unspecified: Secondary | ICD-10-CM

## 2020-11-06 LAB — POCT GLYCOSYLATED HEMOGLOBIN (HGB A1C): Hemoglobin A1C: 5 % (ref 4.0–5.6)

## 2020-11-06 MED ORDER — BUPROPION HCL ER (XL) 150 MG PO TB24: 150.0000 mg | ORAL_TABLET | Freq: Every day | ORAL | 2 refills | Status: DC

## 2020-11-06 MED ORDER — BISOPROLOL-HYDROCHLOROTHIAZIDE 5-6.25 MG PO TABS: 1.0000 | ORAL_TABLET | Freq: Every day | ORAL | 1 refills | Status: DC

## 2020-11-06 NOTE — Progress Notes (Signed)
[facility] [place], [place] [address]  Internal MEDICINE  Office Visit Note  Patient Name: Miranda Briggs  [id number]  [id number]  Date of Service: 11/11/2020  Chief Complaint  Patient presents with   Follow-up   Medical Management of Chronic Issues    Weight managment   Hypertension    HPI Pt is here for routine follow up for weight loss -She was given samples of rybelsus 3mg  and has done ok with it. Did lose 2lbs since last visit. Unfortunately, she will likely not be approved for a script for this since she does not have an impaired glucose. -She has done phentermine in the past with topamax and had mild success but was unable to maintain or lose wieght during medication pauses. Discussed starting wellbutrin as an alternative at this time. -Additionally discussed working on diet and exercise and utilize apps to help follow calorie intake and activity. Had a metabolic test last year showing 1600 calorie goal. -Sees endocrinology for hypothryoidism  Current Medication: Outpatient Encounter Medications as of 11/06/2020  Medication Sig   acetaminophen (TYLENOL) 500 MG tablet Take 1,000 mg by mouth every 6 (six) hours as needed for moderate pain or headache.   bisoprolol-hydrochlorothiazide (ZIAC) 5-6.25 MG tablet Take 1 tablet by mouth daily.   buPROPion (WELLBUTRIN XL) 150 MG 24 hr tablet Take 1 tablet (150 mg total) by mouth daily.   levothyroxine (SYNTHROID) 50 MCG tablet Take 50 mcg by mouth daily before breakfast.   Multiple Vitamin (MULTIVITAMIN WITH MINERALS) TABS tablet Take 1 tablet by mouth every evening.   topiramate (TOPAMAX) 25 MG tablet TAKE 1 TABLET BY MOUTH TWICE A DAY   [DISCONTINUED] bisoprolol-hydrochlorothiazide (ZIAC) 5-6.25 MG tablet Take 1 tablet by mouth daily.   No facility-administered encounter medications on file as of 11/06/2020.    Surgical History: Past Surgical History:  Procedure Laterality Date   BILATERAL  SALPINGECTOMY Bilateral 12/11/2018   Procedure: BILATERAL SALPINGECTOMY;  Surgeon: Schermerhorn, Gwen Her, MD;  Location: [facility];  Service: Gynecology;  Laterality: Bilateral;   CHOLECYSTECTOMY N/A 12/03/2015   Procedure: LAPAROSCOPIC CHOLECYSTECTOMY WITH INTRAOPERATIVE CHOLANGIOGRAM;  Surgeon: Christene Lye, MD;  Location: [facility];  Service: General;  Laterality: N/A;   TONSILLECTOMY     VAGINAL HYSTERECTOMY N/A 12/11/2018   Procedure: HYSTERECTOMY VAGINAL;  Surgeon: Boykin Nearing, MD;  Location: [facility];  Service: Gynecology;  Laterality: N/A;   WISDOM TOOTH EXTRACTION      Medical History: Past Medical History:  Diagnosis Date   GERD (gastroesophageal reflux disease)    Hypertension    Hypothyroidism    Thyroid disease     Family History: Family History  Problem Relation Age of Onset   Diabetes Father    Hyperthyroidism Father    Hypertension Mother     Social History   Socioeconomic History   Marital status: Married    Spouse name: Not on file   Number of children: Not on file   Years of education: Not on file   Highest education level: Not on file  Occupational History   Not on file  Tobacco Use   Smoking status: Never   Smokeless tobacco: Never  Vaping Use   Vaping Use: Never used  Substance and Sexual Activity   Alcohol use: Yes    Comment: [place] WINE   Drug use: No   Sexual activity: Not on file  Other Topics Concern   Not on file  Social History Narrative   Not on file  Social Determinants of Health   Financial Resource Strain: Not on file  Food Insecurity: Not on file  Transportation Needs: Not on file  Physical Activity: Not on file  Stress: Not on file  Social Connections: Not on file  Intimate Partner Violence: Not on file      Review of Systems  Constitutional:  Negative for chills, fatigue and unexpected weight change.  HENT:  Negative for congestion, rhinorrhea, sneezing and sore throat.   Eyes:  Negative for  redness.  Respiratory:  Negative for cough, chest tightness and shortness of breath.   Cardiovascular:  Negative for chest pain and palpitations.  Gastrointestinal:  Negative for abdominal pain, constipation, diarrhea, nausea and vomiting.  Genitourinary:  Negative for dysuria and frequency.  Musculoskeletal:  Negative for arthralgias, back pain, joint swelling and neck pain.  Skin:  Negative for rash.  Neurological: Negative.  Negative for tremors and numbness.  Hematological:  Negative for adenopathy. Does not bruise/bleed easily.  Psychiatric/Behavioral:  Negative for behavioral problems (Depression), sleep disturbance and suicidal ideas. The patient is not nervous/anxious.    Vital Signs: BP 109/82   Pulse 75   Temp 98 F (36.7 C)   Resp 16   Ht 5\' 3"  (1.6 m)   Wt 214 lb (97.1 kg)   SpO2 98%   BMI 37.91 kg/m    Physical Exam Vitals and nursing note reviewed.  Constitutional:      General: She is not in acute distress.    Appearance: Normal appearance. She is obese. She is not ill-appearing.  HENT:     Head: Normocephalic and atraumatic.  Eyes:     Extraocular Movements: Extraocular movements intact.     Pupils: Pupils are equal, round, and reactive to light.  Cardiovascular:     Rate and Rhythm: Normal rate and regular rhythm.  Pulmonary:     Effort: Pulmonary effort is normal. No respiratory distress.  Musculoskeletal:        General: Normal range of motion.  Skin:    General: Skin is warm and dry.  Neurological:     Mental Status: She is alert and oriented to person, place, and time.     Cranial Nerves: No cranial nerve deficit.     Coordination: Coordination normal.     Gait: Gait normal.  Psychiatric:        Mood and Affect: Mood normal.        Behavior: Behavior normal.       Assessment/Plan: 1. Essential hypertension, benign Stable, continue current medication  2. Acquired hypothyroidism Followed by endocrinology  3. Impaired fasting glucose -  POCT HgB A1C is 5.0  4. Class 2 obesity due to excess calories without serious comorbidity with body mass index (BMI) of 37.0 to 37.9 in adult Will start wellbutrin to try to aid weight loss - buPROPion (WELLBUTRIN XL) 150 MG 24 hr tablet; Take 1 tablet (150 mg total) by mouth daily.  Dispense: 30 tablet; Refill: 2 Obesity Counseling: Risk Assessment: An assessment of behavioral risk factors was made today and includes lack of exercise sedentary lifestyle, lack of portion control and poor dietary habits.  Risk Modification Advice: She was counseled on portion control guidelines. Restricting daily caloric intake to 1600. The detrimental long term effects of obesity on her health and ongoing poor compliance was also discussed with the patient.     General Counseling: Neely verbalizes understanding of the findings of todays visit and agrees with plan of treatment. I have discussed any further  diagnostic evaluation that may be needed or ordered today. We also reviewed her medications today. she has been encouraged to call the office with any questions or concerns that should arise related to todays visit.    Orders Placed This Encounter  Procedures   POCT HgB A1C    Meds ordered this encounter  Medications   buPROPion (WELLBUTRIN XL) 150 MG 24 hr tablet    Sig: Take 1 tablet (150 mg total) by mouth daily.    Dispense:  30 tablet    Refill:  2    This patient was seen by Drema Dallas, PA-C in collaboration with Dr. Clayborn Bigness as a part of collaborative care agreement.   Total time spent:30 Minutes Time spent includes review of chart, medications, test results, and follow up plan with the patient.      Dr Lavera Guise Internal medicine

## 2020-11-11 NOTE — Patient Instructions (Signed)
Obesity, Adult ?Obesity is having too much body fat. Being obese means that your weight is more than what is healthy for you.  ?BMI (body mass index) is a number that explains how much body fat you have. If you have a BMI of 30 or more, you are obese. ?Obesity can cause serious health problems, such as: ?Stroke. ?Coronary artery disease (CAD). ?Type 2 diabetes. ?Some types of cancer. ?High blood pressure (hypertension). ?High cholesterol. ?Gallbladder stones. ?Obesity can also contribute to: ?Osteoarthritis. ?Sleep apnea. ?Infertility problems. ?What are the causes? ?Eating meals each day that are high in calories, sugar, and fat. ?Drinking a lot of drinks that have sugar in them. ?Being born with genes that may make you more likely to become obese. ?Having a medical condition that causes obesity. ?Taking certain medicines. ?Sitting a lot (having a sedentary lifestyle). ?Not getting enough sleep. ?What increases the risk? ?Having a family history of obesity. ?Living in an area with limited access to: ?Parks, recreation centers, or sidewalks. ?Healthy food choices, such as grocery stores and farmers' markets. ?What are the signs or symptoms? ?The main sign is having too much body fat. ?How is this treated? ?Treatment for this condition often includes changing your lifestyle. Treatment may include: ?Changing your diet. This may include making a healthy meal plan. ?Exercise. This may include activity that causes your heart to beat faster (aerobic exercise) and strength training. Work with your doctor to design a program that works for you. ?Medicine to help you lose weight. This may be used if you are not able to lose one pound a week after 6 weeks of healthy eating and more exercise. ?Treating conditions that cause the obesity. ?Surgery. Options may include gastric banding and gastric bypass. This may be done if: ?Other treatments have not helped to improve your condition. ?You have a BMI of 40 or higher. ?You have  life-threatening health problems related to obesity. ?Follow these instructions at home: ?Eating and drinking ? ?Follow advice from your doctor about what to eat and drink. Your doctor may tell you to: ?Limit fast food, sweets, and processed snack foods. ?Choose low-fat options. For example, choose low-fat milk instead of whole milk. ?Eat five or more servings of fruits or vegetables each day. ?Eat at home more often. This gives you more control over what you eat. ?Choose healthy foods when you eat out. ?Learn to read food labels. This will help you learn how much food is in one serving. ?Keep low-fat snacks available. ?Avoid drinks that have a lot of sugar in them. These include soda, fruit juice, iced tea with sugar, and flavored milk. ?Drink enough water to keep your pee (urine) pale yellow. ?Do not go on fad diets. ?Physical activity ?Exercise often, as told by your doctor. Most adults should get up to 150 minutes of moderate-intensity exercise every week.Ask your doctor: ?What types of exercise are safe for you. ?How often you should exercise. ?Warm up and stretch before being active. ?Do slow stretching after being active (cool down). ?Rest between times of being active. ?Lifestyle ?Work with your doctor and a food expert (dietitian) to set a weight-loss goal that is best for you. ?Limit your screen time. ?Find ways to reward yourself that do not involve food. ?Do not drink alcohol if: ?Your doctor tells you not to drink. ?You are pregnant, may be pregnant, or are planning to become pregnant. ?If you drink alcohol: ?Limit how much you have to: ?0-1 drink a day for women. ?0-2 drinks   a day for men. Know how much alcohol is in your drink. In the [place] one drink equals one 12 oz bottle of beer (355 mL), one 5 oz glass of wine (148 mL), or one 1 oz glass of hard liquor (44 mL). General instructions Keep a weight-loss journal. This can help you keep track of: The food that you eat. How much exercise you  get. Take over-the-counter and prescription medicines only as told by your doctor. Take vitamins and supplements only as told by your doctor. Think about joining a support group. Pay attention to your mental health as obesity can lead to depression or self esteem issues. Keep all follow-up visits. Contact a doctor if: You cannot meet your weight-loss goal after you have changed your diet and lifestyle for 6 weeks. You are having trouble breathing. Summary Obesity is having too much body fat. Being obese means that your weight is more than what is healthy for you. Work with your doctor to set a weight-loss goal. Get regular exercise as told by your doctor. This information is not intended to replace advice given to you by your health care provider. Make sure you discuss any questions you have with your health care provider. Document Revised: 07/29/2020 Document Reviewed: 07/29/2020 Elsevier Patient Education  [place].

## 2020-11-26 DIAGNOSIS — H52223 Regular astigmatism, bilateral: Secondary | ICD-10-CM | POA: Diagnosis not present

## 2020-12-02 ENCOUNTER — Other Ambulatory Visit: Payer: Self-pay | Admitting: Physician Assistant

## 2020-12-02 DIAGNOSIS — Z6837 Body mass index (BMI) 37.0-37.9, adult: Secondary | ICD-10-CM

## 2020-12-02 DIAGNOSIS — E6609 Other obesity due to excess calories: Secondary | ICD-10-CM

## 2020-12-05 ENCOUNTER — Ambulatory Visit: Payer: BC Managed Care – PPO | Admitting: Physician Assistant

## 2020-12-05 ENCOUNTER — Other Ambulatory Visit: Payer: Self-pay

## 2020-12-05 ENCOUNTER — Encounter: Payer: Self-pay | Admitting: Physician Assistant

## 2020-12-05 DIAGNOSIS — I10 Essential (primary) hypertension: Secondary | ICD-10-CM

## 2020-12-05 DIAGNOSIS — E6609 Other obesity due to excess calories: Secondary | ICD-10-CM

## 2020-12-05 DIAGNOSIS — Z6838 Body mass index (BMI) 38.0-38.9, adult: Secondary | ICD-10-CM

## 2020-12-05 DIAGNOSIS — E782 Mixed hyperlipidemia: Secondary | ICD-10-CM

## 2020-12-05 DIAGNOSIS — E559 Vitamin D deficiency, unspecified: Secondary | ICD-10-CM | POA: Diagnosis not present

## 2020-12-05 DIAGNOSIS — E039 Hypothyroidism, unspecified: Secondary | ICD-10-CM

## 2020-12-05 DIAGNOSIS — R5383 Other fatigue: Secondary | ICD-10-CM

## 2020-12-05 MED ORDER — TOPIRAMATE 25 MG PO TABS
25.0000 mg | ORAL_TABLET | Freq: Two times a day (BID) | ORAL | 0 refills | Status: DC
Start: 2020-12-05 — End: 2021-05-01

## 2020-12-05 MED ORDER — BUPROPION HCL ER (XL) 150 MG PO TB24: 150.0000 mg | ORAL_TABLET | Freq: Every day | ORAL | 1 refills | Status: DC

## 2020-12-05 NOTE — Progress Notes (Signed)
[facility] [place], [place] [address]  Internal MEDICINE  Office Visit Note  Patient Name: Miranda Briggs  [id number]  [id number]  Date of Service: 12/07/2020  Chief Complaint  Patient presents with   Weight Loss    4 wk f/u    HPI Pt is here for routine follow up -tried Fitness pal app and states she was diligent with it for about 5 days then slacked off from it and will try to do better about it, especially after the holidays. -Wellbutrin alone did not help with any wt loss since last visit. Discussed combining topamax and wellbutrin together and may increase topamax to twice per day after 1-2 weeks. -Unfortunately alternative medications have not helped or can not get approved. -Will work on really focusing on diet and exercise and may re-evaluate any new medication options such as rybelsus in the new year if approved for other indications than just diabetes.  -Followed by endocrinology for thyroid, but is due for other repeat labs prior to CPE next visit  Current Medication: Outpatient Encounter Medications as of 12/05/2020  Medication Sig   acetaminophen (TYLENOL) 500 MG tablet Take 1,000 mg by mouth every 6 (six) hours as needed for moderate pain or headache.   bisoprolol-hydrochlorothiazide (ZIAC) 5-6.25 MG tablet Take 1 tablet by mouth daily.   buPROPion (WELLBUTRIN XL) 150 MG 24 hr tablet Take 1 tablet (150 mg total) by mouth daily.   levothyroxine (SYNTHROID) 50 MCG tablet Take 50 mcg by mouth daily before breakfast.   Multiple Vitamin (MULTIVITAMIN WITH MINERALS) TABS tablet Take 1 tablet by mouth every evening.   topiramate (TOPAMAX) 25 MG tablet Take 1 tablet (25 mg total) by mouth 2 (two) times daily.   [DISCONTINUED] buPROPion (WELLBUTRIN XL) 150 MG 24 hr tablet TAKE 1 TABLET BY MOUTH EVERY DAY   [DISCONTINUED] topiramate (TOPAMAX) 25 MG tablet TAKE 1 TABLET BY MOUTH TWICE A DAY (Patient not taking: Reported on 12/05/2020)   No  facility-administered encounter medications on file as of 12/05/2020.    Surgical History: Past Surgical History:  Procedure Laterality Date   BILATERAL SALPINGECTOMY Bilateral 12/11/2018   Procedure: BILATERAL SALPINGECTOMY;  Surgeon: Schermerhorn, Gwen Her, MD;  Location: [facility];  Service: Gynecology;  Laterality: Bilateral;   CHOLECYSTECTOMY N/A 12/03/2015   Procedure: LAPAROSCOPIC CHOLECYSTECTOMY WITH INTRAOPERATIVE CHOLANGIOGRAM;  Surgeon: Christene Lye, MD;  Location: [facility];  Service: General;  Laterality: N/A;   TONSILLECTOMY     VAGINAL HYSTERECTOMY N/A 12/11/2018   Procedure: HYSTERECTOMY VAGINAL;  Surgeon: Boykin Nearing, MD;  Location: [facility];  Service: Gynecology;  Laterality: N/A;   WISDOM TOOTH EXTRACTION      Medical History: Past Medical History:  Diagnosis Date   GERD (gastroesophageal reflux disease)    Hypertension    Hypothyroidism    Thyroid disease     Family History: Family History  Problem Relation Age of Onset   Diabetes Father    Hyperthyroidism Father    Hypertension Mother     Social History   Socioeconomic History   Marital status: Married    Spouse name: Not on file   Number of children: Not on file   Years of education: Not on file   Highest education level: Not on file  Occupational History   Not on file  Tobacco Use   Smoking status: Never   Smokeless tobacco: Never  Vaping Use   Vaping Use: Never used  Substance and Sexual Activity   Alcohol use:  Yes    Comment: OCC WINE   Drug use: No   Sexual activity: Not on file  Other Topics Concern   Not on file  Social History Narrative   Not on file   Social Determinants of Health   Financial Resource Strain: Not on file  Food Insecurity: Not on file  Transportation Needs: Not on file  Physical Activity: Not on file  Stress: Not on file  Social Connections: Not on file  Intimate Partner Violence: Not on file      Review of Systems  Constitutional:   Negative for chills, fatigue and unexpected weight change.  HENT:  Negative for congestion, rhinorrhea, sneezing and sore throat.   Eyes:  Negative for redness.  Respiratory:  Negative for cough, chest tightness and shortness of breath.   Cardiovascular:  Negative for chest pain and palpitations.  Gastrointestinal:  Negative for abdominal pain, constipation, diarrhea, nausea and vomiting.  Genitourinary:  Negative for dysuria and frequency.  Musculoskeletal:  Negative for arthralgias, back pain, joint swelling and neck pain.  Skin:  Negative for rash.  Neurological: Negative.  Negative for tremors and numbness.  Hematological:  Negative for adenopathy. Does not bruise/bleed easily.  Psychiatric/Behavioral:  Negative for behavioral problems (Depression), sleep disturbance and suicidal ideas. The patient is not nervous/anxious.    Vital Signs: BP 126/82   Pulse 77   Temp 98.4 F (36.9 C)   Resp 16   Ht 5\' 3"  (1.6 m)   Wt 214 lb 12.8 oz (97.4 kg)   SpO2 98%   BMI 38.05 kg/m    Physical Exam Vitals and nursing note reviewed.  Constitutional:      General: She is not in acute distress.    Appearance: Normal appearance. She is obese. She is not ill-appearing.  HENT:     Head: Normocephalic and atraumatic.  Eyes:     Extraocular Movements: Extraocular movements intact.     Pupils: Pupils are equal, round, and reactive to light.  Cardiovascular:     Rate and Rhythm: Normal rate and regular rhythm.  Pulmonary:     Effort: Pulmonary effort is normal. No respiratory distress.  Musculoskeletal:        General: Normal range of motion.  Skin:    General: Skin is warm and dry.  Neurological:     Mental Status: She is alert and oriented to person, place, and time.     Cranial Nerves: No cranial nerve deficit.     Coordination: Coordination normal.     Gait: Gait normal.  Psychiatric:        Mood and Affect: Mood normal.        Behavior: Behavior normal.        Assessment/Plan: 1. Class 2 obesity due to excess calories without serious comorbidity with body mass index (BMI) of 38.0 to 38.9 in adult Will try wellbutrin and topamax together to aid wt loss, but pt will also work on diet and exercise and using fitness pal app for regularly. Obesity Counseling: Risk Assessment: An assessment of behavioral risk factors was made today and includes lack of exercise sedentary lifestyle, lack of portion control and poor dietary habits.  Risk Modification Advice: She was counseled on portion control guidelines. Restricting daily caloric intake to 1600. The detrimental long term effects of obesity on her health and ongoing poor compliance was also discussed with the patient.   - buPROPion (WELLBUTRIN XL) 150 MG 24 hr tablet; Take 1 tablet (150 mg total) by mouth  daily.  Dispense: 90 tablet; Refill: 1 - topiramate (TOPAMAX) 25 MG tablet; Take 1 tablet (25 mg total) by mouth 2 (two) times daily.  Dispense: 180 tablet; Refill: 0  2. Essential hypertension, benign Well controlled, continue current medications  3. Vitamin D deficiency - VITAMIN D 25 Hydroxy (Vit-D Deficiency, Fractures); Future  4. Acquired hypothyroidism Followed by endocrinology  5. Mixed hyperlipidemia - Lipid Panel With LDL/HDL Ratio  6. Other fatigue - CBC w/Diff/Platelet - Comprehensive metabolic panel   General Counseling: Miranda Briggs verbalizes understanding of the findings of todays visit and agrees with plan of treatment. I have discussed any further diagnostic evaluation that may be needed or ordered today. We also reviewed her medications today. she has been encouraged to call the office with any questions or concerns that should arise related to todays visit.    Orders Placed This Encounter  Procedures   CBC w/Diff/Platelet   Comprehensive metabolic panel   Lipid Panel With LDL/HDL Ratio   VITAMIN D 25 Hydroxy (Vit-D Deficiency, Fractures)    Meds ordered this  encounter  Medications   buPROPion (WELLBUTRIN XL) 150 MG 24 hr tablet    Sig: Take 1 tablet (150 mg total) by mouth daily.    Dispense:  90 tablet    Refill:  1   topiramate (TOPAMAX) 25 MG tablet    Sig: Take 1 tablet (25 mg total) by mouth 2 (two) times daily.    Dispense:  180 tablet    Refill:  0    This patient was seen by Drema Dallas, PA-C in collaboration with Dr. Clayborn Bigness as a part of collaborative care agreement.   Total time spent:30 Minutes Time spent includes review of chart, medications, test results, and follow up plan with the patient.      Dr Lavera Guise Internal medicine

## 2020-12-12 DIAGNOSIS — E039 Hypothyroidism, unspecified: Secondary | ICD-10-CM | POA: Diagnosis not present

## 2021-02-18 DIAGNOSIS — G5781 Other specified mononeuropathies of right lower limb: Secondary | ICD-10-CM | POA: Diagnosis not present

## 2021-02-18 DIAGNOSIS — M79671 Pain in right foot: Secondary | ICD-10-CM | POA: Diagnosis not present

## 2021-02-18 DIAGNOSIS — M7741 Metatarsalgia, right foot: Secondary | ICD-10-CM | POA: Diagnosis not present

## 2021-02-18 DIAGNOSIS — Q6671 Congenital pes cavus, right foot: Secondary | ICD-10-CM | POA: Diagnosis not present

## 2021-03-06 DIAGNOSIS — R5383 Other fatigue: Secondary | ICD-10-CM | POA: Diagnosis not present

## 2021-03-07 LAB — CBC WITH DIFFERENTIAL/PLATELET
Basophils Absolute: 0.1 10*3/uL (ref 0.0–0.2)
Basos: 1 %
EOS (ABSOLUTE): 0.2 10*3/uL (ref 0.0–0.4)
Eos: 2 %
Hematocrit: 42.9 % (ref 34.0–46.6)
Hemoglobin: 14.9 g/dL (ref 11.1–15.9)
Immature Grans (Abs): 0 10*3/uL (ref 0.0–0.1)
Immature Granulocytes: 0 %
Lymphocytes Absolute: 2.8 10*3/uL (ref 0.7–3.1)
Lymphs: 28 %
MCH: 31.6 pg (ref 26.6–33.0)
MCHC: 34.7 g/dL (ref 31.5–35.7)
MCV: 91 fL (ref 79–97)
Monocytes Absolute: 0.8 10*3/uL (ref 0.1–0.9)
Monocytes: 8 %
Neutrophils Absolute: 6.2 10*3/uL (ref 1.4–7.0)
Neutrophils: 61 %
Platelets: 364 10*3/uL (ref 150–450)
RBC: 4.71 x10E6/uL (ref 3.77–5.28)
RDW: 11.5 % — ABNORMAL LOW (ref 11.7–15.4)
WBC: 10.1 10*3/uL (ref 3.4–10.8)

## 2021-03-07 LAB — COMPREHENSIVE METABOLIC PANEL
ALT: 43 IU/L — ABNORMAL HIGH (ref 0–32)
AST: 26 IU/L (ref 0–40)
Albumin/Globulin Ratio: 1.9 (ref 1.2–2.2)
Albumin: 4.5 g/dL (ref 3.8–4.8)
Alkaline Phosphatase: 86 IU/L (ref 44–121)
BUN/Creatinine Ratio: 11 (ref 9–23)
BUN: 10 mg/dL (ref 6–20)
Bilirubin Total: 0.5 mg/dL (ref 0.0–1.2)
CO2: 23 mmol/L (ref 20–29)
Calcium: 9.4 mg/dL (ref 8.7–10.2)
Chloride: 102 mmol/L (ref 96–106)
Creatinine, Ser: 0.91 mg/dL (ref 0.57–1.00)
Globulin, Total: 2.4 g/dL (ref 1.5–4.5)
Glucose: 105 mg/dL — ABNORMAL HIGH (ref 70–99)
Potassium: 4 mmol/L (ref 3.5–5.2)
Sodium: 138 mmol/L (ref 134–144)
Total Protein: 6.9 g/dL (ref 6.0–8.5)
eGFR: 86 mL/min/{1.73_m2} (ref >59)

## 2021-03-07 LAB — LIPID PANEL WITH LDL/HDL RATIO
Cholesterol, Total: 156 mg/dL (ref 100–199)
HDL: 38 mg/dL — ABNORMAL LOW (ref >39)
LDL Chol Calc (NIH): 88 mg/dL (ref 0–99)
LDL/HDL Ratio: 2.3 ratio (ref 0.0–3.2)
Triglycerides: 173 mg/dL — ABNORMAL HIGH (ref 0–149)
VLDL Cholesterol Cal: 30 mg/dL (ref 5–40)

## 2021-03-11 DIAGNOSIS — M79671 Pain in right foot: Secondary | ICD-10-CM | POA: Diagnosis not present

## 2021-03-11 DIAGNOSIS — M792 Neuralgia and neuritis, unspecified: Secondary | ICD-10-CM | POA: Diagnosis not present

## 2021-03-11 DIAGNOSIS — Q6671 Congenital pes cavus, right foot: Secondary | ICD-10-CM | POA: Diagnosis not present

## 2021-03-11 DIAGNOSIS — M7741 Metatarsalgia, right foot: Secondary | ICD-10-CM | POA: Diagnosis not present

## 2021-03-11 DIAGNOSIS — G5781 Other specified mononeuropathies of right lower limb: Secondary | ICD-10-CM | POA: Diagnosis not present

## 2021-03-19 ENCOUNTER — Encounter: Payer: BC Managed Care – PPO | Admitting: Physician Assistant

## 2021-05-01 ENCOUNTER — Other Ambulatory Visit: Payer: Self-pay

## 2021-05-01 ENCOUNTER — Encounter: Payer: Self-pay | Admitting: Physician Assistant

## 2021-05-01 ENCOUNTER — Ambulatory Visit (INDEPENDENT_AMBULATORY_CARE_PROVIDER_SITE_OTHER): Payer: BC Managed Care – PPO | Admitting: Physician Assistant

## 2021-05-01 VITALS — BP 124/80 | HR 77 | Temp 98.3°F | Resp 16 | Ht 63.0 in | Wt 220.2 lb

## 2021-05-01 DIAGNOSIS — I10 Essential (primary) hypertension: Secondary | ICD-10-CM

## 2021-05-01 DIAGNOSIS — E669 Obesity, unspecified: Secondary | ICD-10-CM

## 2021-05-01 DIAGNOSIS — Z01419 Encounter for gynecological examination (general) (routine) without abnormal findings: Secondary | ICD-10-CM

## 2021-05-01 DIAGNOSIS — E039 Hypothyroidism, unspecified: Secondary | ICD-10-CM | POA: Diagnosis not present

## 2021-05-01 DIAGNOSIS — E782 Mixed hyperlipidemia: Secondary | ICD-10-CM | POA: Diagnosis not present

## 2021-05-01 DIAGNOSIS — Z0001 Encounter for general adult medical examination with abnormal findings: Secondary | ICD-10-CM

## 2021-05-01 DIAGNOSIS — R300 Dysuria: Secondary | ICD-10-CM | POA: Diagnosis not present

## 2021-05-01 DIAGNOSIS — E6609 Other obesity due to excess calories: Secondary | ICD-10-CM

## 2021-05-01 MED ORDER — CHOLESTYRAMINE 4 G PO PACK: 4.0000 g | PACK | Freq: Three times a day (TID) | ORAL | 1 refills | Status: DC

## 2021-05-01 MED ORDER — BISOPROLOL-HYDROCHLOROTHIAZIDE 5-6.25 MG PO TABS: 1.0000 | ORAL_TABLET | Freq: Every day | ORAL | 1 refills | Status: DC

## 2021-05-01 MED ORDER — CHOLESTYRAMINE 4 G PO PACK: 4.0000 g | PACK | Freq: Three times a day (TID) | ORAL | 12 refills | Status: DC

## 2021-05-01 MED ORDER — BUPROPION HCL ER (XL) 150 MG PO TB24: 150.0000 mg | ORAL_TABLET | Freq: Every day | ORAL | 1 refills | Status: DC

## 2021-05-01 MED ORDER — TOPIRAMATE 25 MG PO TABS: 25.0000 mg | ORAL_TABLET | Freq: Two times a day (BID) | ORAL | 1 refills | Status: DC

## 2021-05-01 MED ORDER — LEVOTHYROXINE SODIUM 50 MCG PO TABS
50.0000 ug | ORAL_TABLET | Freq: Every day | ORAL | 1 refills | Status: DC
Start: 2021-05-01 — End: 2022-11-08

## 2021-05-01 NOTE — Progress Notes (Signed)
?[facility] ?[address] ?[place], [place] [address] ? ?Internal MEDICINE  ?Office Visit Note ? ?Patient Name: Miranda Briggs ? [id number]  ?[id number] ? ?Date of Service: 05/01/2021 ? ?Chief Complaint  ?Patient presents with  ? Annual Exam  ? Gastroesophageal Reflux  ? Hypertension  ? ? ? ?HPI ?Pt is here for routine health maintenance examination ?-labs reviewed showing imparied glucose, elevated TG, and slightly elevated ALT. Has been taking more tylenol for foot pain recently and has steroids which likely led to elevated glucose as A1c previously checked and normal. 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 She was put on steroids for this which caused to wt gain in addition to her not being able to exercise. Then did injection in foot as well. ?-wearing different insoles which seem to be helping. Was told by podiatry if not improving then may have to see neurology  ?-Thinks wellbutrin and topamax may have been helping wt loss until foot starting causing problems and will continue on these. Has been taking only 94m once per day of topamax and may increase to 535m ?-Since having GB out, after eating will need to go to restroom quickly and is wondering if any medication to help, such as bile acid med. Will give this a try  ?-Started with a heat rash under breasts this morning and states this usually happens every year when the weather changes and will resolve on its own. Will call if not improving as cream may need to be sent in. ?-BP stable ?-Does request to have refills sent to mail pharmacy when they are due as this is cheaper for her ? ?Current Medication: ?Outpatient Encounter Medications as of 05/01/2021  ?Medication Sig  ? acetaminophen (TYLENOL) 500 MG tablet Take 1,000 mg by mouth every 6 (six) hours as needed for  moderate pain or headache.  ? bisoprolol-hydrochlorothiazide (ZIAC) 5-6.25 MG tablet Take 1 tablet by mouth daily.  ? buPROPion (WELLBUTRIN XL) 150 MG 24 hr tablet Take 1 tablet (150 mg total) by mouth daily.  ? cholestyramine (QUESTRAN) 4 g packet Take 1 packet (4 g total) by mouth 3 (three) times daily with meals.  ? levothyroxine (SYNTHROID) 50 MCG tablet Take 50 mcg by mouth daily before breakfast.  ? Multiple Vitamin (MULTIVITAMIN WITH MINERALS) TABS tablet Take 1 tablet by mouth every evening.  ? topiramate (TOPAMAX) 25 MG tablet Take 1 tablet (25 mg total) by mouth 2 (two) times daily.  ? ?No facility-administered encounter medications on file as of 05/01/2021.  ? ? ?Surgical History: ?Past Surgical History:  ?Procedure Laterality Date  ? BILATERAL SALPINGECTOMY Bilateral 12/11/2018  ? Procedure: BILATERAL SALPINGECTOMY;  Surgeon: Schermerhorn, ThGwen HerMD;  Location: [facility];  Service: Gynecology;  Laterality: Bilateral;  ? CHOLECYSTECTOMY N/A 12/03/2015  ? Procedure: LAPAROSCOPIC CHOLECYSTECTOMY WITH INTRAOPERATIVE CHOLANGIOGRAM;  Surgeon: SeChristene LyeMD;  Location: [facility];  Service: General;  Laterality: N/A;  ? TONSILLECTOMY    ? VAGINAL HYSTERECTOMY N/A 12/11/2018  ? Procedure: HYSTERECTOMY VAGINAL;  Surgeon: Schermerhorn, ThGwen HerMD;  Location: [facility];  Service: Gynecology;  Laterality: N/A;  ? WISDOM TOOTH EXTRACTION    ? ? ?Medical History: ?Past Medical History:  ?Diagnosis Date  ? GERD (gastroesophageal reflux disease)   ? Hypertension   ? Hypothyroidism   ? Thyroid disease   ? ? ?  Family History: ?Family History  ?Problem Relation Age of Onset  ? Diabetes Father   ? Hyperthyroidism Father   ? Hypertension Mother   ? ? ? ? ?Review of Systems  ?Constitutional:  Negative for chills, fatigue and unexpected weight change.  ?HENT:  Negative for congestion, rhinorrhea, sneezing and sore throat.   ?Eyes:  Negative for redness.  ?Respiratory:  Negative for cough, chest tightness and shortness  of breath.   ?Cardiovascular:  Negative for chest pain and palpitations.  ?Gastrointestinal:  Negative for abdominal pain, constipation, diarrhea, nausea and vomiting.  ?Genitourinary:  Negative for dysuria and frequency.  ?Musculoskeletal:  Positive for arthralgias. Negative for back pain, joint swelling and neck pain.  ?     Right foot pain  ?Skin:  Positive for rash.  ?     Heat rash under breasts  ?Neurological: Negative.  Negative for tremors and numbness.  ?Hematological:  Negative for adenopathy. Does not bruise/bleed easily.  ?Psychiatric/Behavioral:  Negative for behavioral problems (Depression), sleep disturbance and suicidal ideas. The patient is not nervous/anxious.   ? ? ?Vital Signs: ?BP 124/80   Pulse 77   Temp 98.3 ?F (36.8 ?C)   Resp 16   Ht 5' 3" (1.6 m)   Wt 220 lb 3.2 oz (99.9 kg)   SpO2 98%   BMI 39.01 kg/m?  ? ? ?Physical Exam ?Vitals and nursing note reviewed.  ?Constitutional:   ?   General: She is not in acute distress. ?   Appearance: Normal appearance. She is obese. She is not ill-appearing.  ?HENT:  ?   Head: Normocephalic and atraumatic.  ?Eyes:  ?   Extraocular Movements: Extraocular movements intact.  ?   Pupils: Pupils are equal, round, and reactive to light.  ?Cardiovascular:  ?   Rate and Rhythm: Normal rate and regular rhythm.  ?Pulmonary:  ?   Effort: Pulmonary effort is normal. No respiratory distress.  ?Chest:  ?   Chest wall: No tenderness.  ?Breasts: ?   Right: Normal. No mass.  ?   Left: Normal. No mass.  ?   Comments: Erythematous rash under both breasts ?Abdominal:  ?   General: Abdomen is flat. Bowel sounds are normal.  ?   Palpations: Abdomen is soft.  ?   Tenderness: There is no abdominal tenderness.  ?Musculoskeletal:     ?   General: Normal range of motion.  ?Skin: ?   General: Skin is warm and dry.  ?Neurological:  ?   Mental Status: She is alert and oriented to person, place, and time.  ?   Cranial Nerves: No cranial nerve deficit.  ?   Coordination:  Coordination normal.  ?   Gait: Gait normal.  ?Psychiatric:     ?   Mood and Affect: Mood normal.     ?   Behavior: Behavior normal.  ? ? ? ?LABS: ?Recent Results (from the past 2160 hour(s))  ?CBC w/Diff/Platelet     Status: Abnormal  ? Collection Time: 03/06/21  8:54 AM  ?Result Value Ref Range  ? WBC 10.1 3.4 - 10.8 x10E3/uL  ? RBC 4.71 3.77 - 5.28 x10E6/uL  ? Hemoglobin 14.9 11.1 - 15.9 g/dL  ? Hematocrit 42.9 34.0 - 46.6 %  ? MCV 91 79 - 97 fL  ? MCH 31.6 26.6 - 33.0 pg  ? MCHC 34.7 31.5 - 35.7 g/dL  ? RDW 11.5 (L) 11.7 - 15.4 %  ? Platelets 364 150 - 450 x10E3/uL  ? Neutrophils 61  Not Estab. %  ? Lymphs 28 Not Estab. %  ? Monocytes 8 Not Estab. %  ? Eos 2 Not Estab. %  ? Basos 1 Not Estab. %  ? Neutrophils Absolute 6.2 1.4 - 7.0 x10E3/uL  ? Lymphocytes Absolute 2.8 0.7 - 3.1 x10E3/uL  ? Monocytes Absolute 0.8 0.1 - 0.9 x10E3/uL  ? EOS (ABSOLUTE) 0.2 0.0 - 0.4 x10E3/uL  ? Basophils Absolute 0.1 0.0 - 0.2 x10E3/uL  ? Immature Granulocytes 0 Not Estab. %  ? Immature Grans (Abs) 0.0 0.0 - 0.1 x10E3/uL  ?Comprehensive metabolic panel     Status: Abnormal  ? Collection Time: 03/06/21  8:54 AM  ?Result Value Ref Range  ? Glucose 105 (H) 70 - 99 mg/dL  ? BUN 10 6 - 20 mg/dL  ? Creatinine, Ser 0.91 0.57 - 1.00 mg/dL  ? eGFR 86 >59 mL/min/1.73  ? BUN/Creatinine Ratio 11 9 - 23  ? Sodium 138 134 - 144 mmol/L  ? Potassium 4.0 3.5 - 5.2 mmol/L  ? Chloride 102 96 - 106 mmol/L  ? CO2 23 20 - 29 mmol/L  ? Calcium 9.4 8.7 - 10.2 mg/dL  ? Total Protein 6.9 6.0 - 8.5 g/dL  ? Albumin 4.5 3.8 - 4.8 g/dL  ? Globulin, Total 2.4 1.5 - 4.5 g/dL  ? Albumin/Globulin Ratio 1.9 1.2 - 2.2  ? Bilirubin Total 0.5 0.0 - 1.2 mg/dL  ? Alkaline Phosphatase 86 44 - 121 IU/L  ? AST 26 0 - 40 IU/L  ? ALT 43 (H) 0 - 32 IU/L  ?Lipid Panel With LDL/HDL Ratio     Status: Abnormal  ? Collection Time: 03/06/21  8:54 AM  ?Result Value Ref Range  ? Cholesterol, Total 156 100 - 199 mg/dL  ? Triglycerides 173 (H) 0 - 149 mg/dL  ? HDL 38 (L) >39 mg/dL  ? VLDL  Cholesterol Cal 30 5 - 40 mg/dL  ? LDL Chol Calc (NIH) 88 0 - 99 mg/dL  ? LDL/HDL Ratio 2.3 0.0 - 3.2 ratio  ?  Comment:                                     LDL/HDL Ratio ?                                            Men

## 2021-05-02 LAB — UA/M W/RFLX CULTURE, ROUTINE
Bilirubin, UA: NEGATIVE
Glucose, UA: NEGATIVE
Ketones, UA: NEGATIVE
Leukocytes,UA: NEGATIVE
Nitrite, UA: NEGATIVE
Protein,UA: NEGATIVE
RBC, UA: NEGATIVE
Specific Gravity, UA: 1.012 (ref 1.005–1.030)
Urobilinogen, Ur: 0.2 mg/dL (ref 0.2–1.0)
pH, UA: 7.5 (ref 5.0–7.5)

## 2021-05-02 LAB — MICROSCOPIC EXAMINATION
Bacteria, UA: NONE SEEN
Casts: NONE SEEN /LPF
Epithelial Cells (non renal): NONE SEEN /HPF (ref 0–10)
RBC, Urine: NONE SEEN /HPF (ref 0–2)
WBC, UA: NONE SEEN /HPF (ref 0–5)

## 2021-07-26 ENCOUNTER — Other Ambulatory Visit: Payer: Self-pay | Admitting: Physician Assistant

## 2021-07-26 DIAGNOSIS — I10 Essential (primary) hypertension: Secondary | ICD-10-CM

## 2021-08-31 ENCOUNTER — Ambulatory Visit: Payer: BC Managed Care – PPO | Admitting: Physician Assistant

## 2021-08-31 DIAGNOSIS — I10 Essential (primary) hypertension: Secondary | ICD-10-CM

## 2021-08-31 DIAGNOSIS — E669 Obesity, unspecified: Secondary | ICD-10-CM

## 2021-08-31 DIAGNOSIS — E039 Hypothyroidism, unspecified: Secondary | ICD-10-CM | POA: Diagnosis not present

## 2021-08-31 NOTE — Progress Notes (Signed)
[facility] [place], [place] [address]  Internal MEDICINE  Office Visit Note  Patient Name: Miranda Briggs  [id number]  [id number]  Date of Service: 08/31/2021  Chief Complaint  Patient presents with   Follow-up   Hypertension   Gastroesophageal Reflux    HPI Pt is here for routine follow up -taking topamax and wellbutrin to help with weight loss and is down 2 lbs since last visit -cholestyramine helped some, but hard to use sometimes due to needig to take it an hour before she eats and sometimes doesn't have set routine enough to plan for that -Bp normally around 125/88 at home -Doing light weights and yoga to avoid pressure on the foot since she had interdigital neuroma on the right foot. She is doing some light walking as well. She has not gone back to podiatry since cortisone shot didn't really help and she is already wearing the orthotics they gave her and is a little better. They had mentioned she may need to see neurology if not improving so she will monitor and then decide if any further intervention or referral needed.  Current Medication: Outpatient Encounter Medications as of 08/31/2021  Medication Sig   acetaminophen (TYLENOL) 500 MG tablet Take 1,000 mg by mouth every 6 (six) hours as needed for moderate pain or headache.   bisoprolol-hydrochlorothiazide (ZIAC) 5-6.25 MG tablet TAKE 1 TABLET BY MOUTH EVERY DAY   buPROPion (WELLBUTRIN XL) 150 MG 24 hr tablet Take 1 tablet (150 mg total) by mouth daily.   cholestyramine (QUESTRAN) 4 g packet Take 1 packet (4 g total) by mouth 3 (three) times daily with meals.   levothyroxine (SYNTHROID) 50 MCG tablet Take 1 tablet (50 mcg total) by mouth daily before breakfast.   Multiple Vitamin (MULTIVITAMIN WITH MINERALS) TABS tablet Take 1 tablet by mouth every evening.   topiramate (TOPAMAX) 25 MG tablet Take 1 tablet (25 mg total) by mouth 2 (two) times daily.   No facility-administered encounter  medications on file as of 08/31/2021.    Surgical History: Past Surgical History:  Procedure Laterality Date   BILATERAL SALPINGECTOMY Bilateral 12/11/2018   Procedure: BILATERAL SALPINGECTOMY;  Surgeon: Schermerhorn, Gwen Her, MD;  Location: [facility];  Service: Gynecology;  Laterality: Bilateral;   CHOLECYSTECTOMY N/A 12/03/2015   Procedure: LAPAROSCOPIC CHOLECYSTECTOMY WITH INTRAOPERATIVE CHOLANGIOGRAM;  Surgeon: Christene Lye, MD;  Location: [facility];  Service: General;  Laterality: N/A;   TONSILLECTOMY     VAGINAL HYSTERECTOMY N/A 12/11/2018   Procedure: HYSTERECTOMY VAGINAL;  Surgeon: Boykin Nearing, MD;  Location: [facility];  Service: Gynecology;  Laterality: N/A;   WISDOM TOOTH EXTRACTION      Medical History: Past Medical History:  Diagnosis Date   GERD (gastroesophageal reflux disease)    Hypertension    Hypothyroidism    Thyroid disease     Family History: Family History  Problem Relation Age of Onset   Diabetes Father    Hyperthyroidism Father    Hypertension Mother     Social History   Socioeconomic History   Marital status: Married    Spouse name: Not on file   Number of children: Not on file   Years of education: Not on file   Highest education level: Not on file  Occupational History   Not on file  Tobacco Use   Smoking status: Never   Smokeless tobacco: Never  Vaping Use   Vaping Use: Never used  Substance and Sexual Activity   Alcohol use: Yes  Comment: OCC WINE   Drug use: No   Sexual activity: Not on file  Other Topics Concern   Not on file  Social History Narrative   Not on file   Social Determinants of Health   Financial Resource Strain: Not on file  Food Insecurity: Not on file  Transportation Needs: Not on file  Physical Activity: Not on file  Stress: Not on file  Social Connections: Not on file  Intimate Partner Violence: Not on file      Review of Systems  Constitutional:  Negative for chills, fatigue and  unexpected weight change.  HENT:  Negative for congestion, rhinorrhea, sneezing and sore throat.   Eyes:  Negative for redness.  Respiratory:  Negative for cough, chest tightness and shortness of breath.   Cardiovascular:  Negative for chest pain and palpitations.  Gastrointestinal:  Negative for abdominal pain, constipation, diarrhea, nausea and vomiting.  Genitourinary:  Negative for dysuria and frequency.  Musculoskeletal:  Positive for arthralgias. Negative for back pain, joint swelling and neck pain.       Right foot pain  Skin:  Negative for rash.  Neurological: Negative.  Negative for tremors and numbness.  Hematological:  Negative for adenopathy. Does not bruise/bleed easily.  Psychiatric/Behavioral:  Negative for behavioral problems (Depression), sleep disturbance and suicidal ideas. The patient is not nervous/anxious.     Vital Signs: BP 132/84 Comment: 128/90  Pulse 68   Temp 97.8 F (36.6 C)   Resp 16   Ht '5\' 3"'$  (1.6 m)   Wt 218 lb (98.9 kg)   SpO2 99%   BMI 38.62 kg/m    Physical Exam Vitals and nursing note reviewed.  Constitutional:      General: She is not in acute distress.    Appearance: Normal appearance. She is obese. She is not ill-appearing.  HENT:     Head: Normocephalic and atraumatic.  Eyes:     Extraocular Movements: Extraocular movements intact.     Pupils: Pupils are equal, round, and reactive to light.  Cardiovascular:     Rate and Rhythm: Normal rate and regular rhythm.  Pulmonary:     Effort: Pulmonary effort is normal. No respiratory distress.  Musculoskeletal:        General: Normal range of motion.  Skin:    General: Skin is warm and dry.  Neurological:     Mental Status: She is alert and oriented to person, place, and time.     Cranial Nerves: No cranial nerve deficit.     Coordination: Coordination normal.     Gait: Gait normal.  Psychiatric:        Mood and Affect: Mood normal.        Behavior: Behavior normal.         Assessment/Plan: 1. Essential hypertension, benign Stable, continue current medication  2. Acquired hypothyroidism Followed by endocrinology  3. Obesity (BMI 30-39.9) Down 2 lbs since last visit. May continue topamax and wellbutrin as well as working on diet and exercise as able.   General Counseling: Maryse verbalizes understanding of the findings of todays visit and agrees with plan of treatment. I have discussed any further diagnostic evaluation that may be needed or ordered today. We also reviewed her medications today. she has been encouraged to call the office with any questions or concerns that should arise related to todays visit.    No orders of the defined types were placed in this encounter.   No orders of the defined types were placed  in this encounter.   This patient was seen by Drema Dallas, PA-C in collaboration with Dr. Clayborn Bigness as a part of collaborative care agreement.   Total time spent:30 Minutes Time spent includes review of chart, medications, test results, and follow up plan with the patient.      Dr Lavera Guise Internal medicine

## 2021-12-13 ENCOUNTER — Other Ambulatory Visit: Payer: Self-pay | Admitting: Internal Medicine

## 2021-12-13 MED ORDER — HYDROCOD POLI-CHLORPHE POLI ER 10-8 MG/5ML PO SUER: 5.0000 mL | Freq: Two times a day (BID) | ORAL | 0 refills | Status: DC | PRN

## 2021-12-13 MED ORDER — NIRMATRELVIR/RITONAVIR (PAXLOVID)TABLET: 3.0000 | ORAL_TABLET | Freq: Two times a day (BID) | ORAL | 0 refills | Status: AC

## 2021-12-13 MED ORDER — PREDNISONE 10 MG PO TABS: ORAL_TABLET | ORAL | 0 refills | Status: DC

## 2021-12-14 ENCOUNTER — Telehealth: Payer: BC Managed Care – PPO | Admitting: Nurse Practitioner

## 2021-12-14 ENCOUNTER — Encounter: Payer: Self-pay | Admitting: Nurse Practitioner

## 2021-12-14 ENCOUNTER — Telehealth: Payer: Self-pay | Admitting: Physician Assistant

## 2021-12-14 VITALS — BP 126/100 | HR 96 | Temp 97.5°F | Resp 16 | Ht 63.0 in | Wt 210.0 lb

## 2021-12-14 DIAGNOSIS — U071 COVID-19: Secondary | ICD-10-CM

## 2021-12-14 DIAGNOSIS — J069 Acute upper respiratory infection, unspecified: Secondary | ICD-10-CM | POA: Diagnosis not present

## 2021-12-14 NOTE — Telephone Encounter (Signed)
Payment receipt emailed to patient-Miranda Briggs

## 2021-12-14 NOTE — Progress Notes (Signed)
[facility] [place], [place] [address]  Internal MEDICINE  Telephone Visit  Patient Name: Miranda Briggs  [id number]  [id number]  Date of Service: 12/14/2021  I connected with the patient at 1230 by telephone and verified the patients identity using two identifiers.   I discussed the limitations, risks, security and privacy concerns of performing an evaluation and management service by telephone and the availability of in person appointments. I also discussed with the patient that there may be a patient responsible charge related to the service.  The patient expressed understanding and agrees to proceed.    Chief Complaint  Patient presents with   Telephone Screen    [phone]   Telephone Assessment    Covid positive, fever yesterday     Headache   Sore Throat   Cough    Headache  Associated symptoms include coughing, rhinorrhea, sinus pressure and a sore throat.  Sore Throat  Associated symptoms include congestion, coughing and headaches. Pertinent negatives include no shortness of breath.  Cough Associated symptoms include headaches, postnasal drip, rhinorrhea and a sore throat. Pertinent negatives include no chest pain, shortness of breath or wheezing.   Miranda Briggs presents for a telehealth virtual visit for positive covid upper respiratory infection --headache, sore throat, cough --fever started yesterday.  --onset was yesterday  Paxlovid and cough medication was called in yesterday   Current Medication: Outpatient Encounter Medications as of 12/14/2021  Medication Sig   acetaminophen (TYLENOL) 500 MG tablet Take 1,000 mg by mouth every 6 (six) hours as needed for moderate pain or headache.   bisoprolol-hydrochlorothiazide (ZIAC) 5-6.25 MG tablet TAKE 1 TABLET BY MOUTH EVERY DAY   buPROPion (WELLBUTRIN XL) 150 MG 24 hr tablet Take 1 tablet (150 mg total) by mouth daily.   chlorpheniramine-HYDROcodone (TUSSIONEX) 10-8 MG/5ML Take 5 mLs by mouth  every 12 (twelve) hours as needed for cough.   cholestyramine (QUESTRAN) 4 g packet Take 1 packet (4 g total) by mouth 3 (three) times daily with meals.   levothyroxine (SYNTHROID) 50 MCG tablet Take 1 tablet (50 mcg total) by mouth daily before breakfast.   Multiple Vitamin (MULTIVITAMIN WITH MINERALS) TABS tablet Take 1 tablet by mouth every evening.   nirmatrelvir/ritonavir EUA (PAXLOVID) 20 x 150 MG & 10 x '100MG'$  TABS Take 3 tablets by mouth 2 (two) times daily for 5 days. (Take nirmatrelvir 150 mg two tablets twice daily for 5 days and ritonavir 100 mg one tablet twice daily for 5 days) Patient GFR is normal   predniSONE (DELTASONE) 10 MG tablet Take one tab 3 x day for 3 days, then take one tab 2 x a day for 3 days and then take one tab a day for 3 days for copd   topiramate (TOPAMAX) 25 MG tablet Take 1 tablet (25 mg total) by mouth 2 (two) times daily.   No facility-administered encounter medications on file as of 12/14/2021.    Surgical History: Past Surgical History:  Procedure Laterality Date   BILATERAL SALPINGECTOMY Bilateral 12/11/2018   Procedure: BILATERAL SALPINGECTOMY;  Surgeon: Schermerhorn, Gwen Her, MD;  Location: [facility];  Service: Gynecology;  Laterality: Bilateral;   CHOLECYSTECTOMY N/A 12/03/2015   Procedure: LAPAROSCOPIC CHOLECYSTECTOMY WITH INTRAOPERATIVE CHOLANGIOGRAM;  Surgeon: Christene Lye, MD;  Location: [facility];  Service: General;  Laterality: N/A;   TONSILLECTOMY     VAGINAL HYSTERECTOMY N/A 12/11/2018   Procedure: HYSTERECTOMY VAGINAL;  Surgeon: Boykin Nearing, MD;  Location: [facility];  Service: Gynecology;  Laterality: N/A;  WISDOM TOOTH EXTRACTION      Medical History: Past Medical History:  Diagnosis Date   GERD (gastroesophageal reflux disease)    Hypertension    Hypothyroidism    Thyroid disease     Family History: Family History  Problem Relation Age of Onset   Diabetes Father    Hyperthyroidism Father    Hypertension  Mother     Social History   Socioeconomic History   Marital status: Married    Spouse name: Not on file   Number of children: Not on file   Years of education: Not on file   Highest education level: Not on file  Occupational History   Not on file  Tobacco Use   Smoking status: Never   Smokeless tobacco: Never  Vaping Use   Vaping Use: Never used  Substance and Sexual Activity   Alcohol use: Yes    Comment: OCC WINE   Drug use: No   Sexual activity: Not on file  Other Topics Concern   Not on file  Social History Narrative   Not on file   Social Determinants of Health   Financial Resource Strain: Not on file  Food Insecurity: Not on file  Transportation Needs: Not on file  Physical Activity: Not on file  Stress: Not on file  Social Connections: Not on file  Intimate Partner Violence: Not on file      Review of Systems  Constitutional:  Positive for fatigue.  HENT:  Positive for congestion, postnasal drip, rhinorrhea, sinus pressure and sore throat.   Respiratory:  Positive for cough. Negative for chest tightness, shortness of breath and wheezing.   Cardiovascular: Negative.  Negative for chest pain and palpitations.  Neurological:  Positive for headaches.    Vital Signs: BP (!) 126/100   Pulse 96   Temp (!) 97.5 F (36.4 C)   Resp 16   Ht '5\' 3"'$  (1.6 m)   Wt 210 lb (95.3 kg)   BMI 37.20 kg/m    Observation/Objective: She is alert and oriented and engages in conversation appropriately. She does not sound as though she is in any acute distress over telephone call.     Assessment/Plan: 1. Upper respiratory tract infection due to COVID-19 virus Was prescribed  cough medication and paxlovid yesterday, following up, doing ok, starting medication today.    General Counseling: Miranda Briggs verbalizes understanding of the findings of today's phone visit and agrees with plan of treatment. I have discussed any further diagnostic evaluation that may be needed or  ordered today. We also reviewed her medications today. she has been encouraged to call the office with any questions or concerns that should arise related to todays visit.  Return if symptoms worsen or fail to improve.   No orders of the defined types were placed in this encounter.   No orders of the defined types were placed in this encounter.   Time spent:10 Minutes Time spent with patient included reviewing progress notes, labs, imaging studies, and discussing plan for follow up.  [place] Controlled Substance Database was reviewed by me for overdose risk score (ORS) if appropriate.  This patient was seen by Jonetta Osgood, FNP-C in collaboration with Dr. Clayborn Bigness as a part of collaborative care agreement.  Miranda Briggs R. Valetta Fuller, MSN, FNP-C Internal medicine

## 2021-12-18 ENCOUNTER — Ambulatory Visit: Payer: BC Managed Care – PPO | Admitting: Physician Assistant

## 2021-12-29 ENCOUNTER — Telehealth: Payer: Self-pay

## 2021-12-29 NOTE — Telephone Encounter (Signed)
Pt husband  called that she is having fever and coughing and covid test is negative advised him that need appt they are not home by 6:30 as per dr Humphrey Rolls advised that she need to go to urgent care may need  chest xray

## 2021-12-30 DIAGNOSIS — J101 Influenza due to other identified influenza virus with other respiratory manifestations: Secondary | ICD-10-CM | POA: Diagnosis not present

## 2021-12-30 DIAGNOSIS — Z03818 Encounter for observation for suspected exposure to other biological agents ruled out: Secondary | ICD-10-CM | POA: Diagnosis not present

## 2021-12-30 DIAGNOSIS — J028 Acute pharyngitis due to other specified organisms: Secondary | ICD-10-CM | POA: Diagnosis not present

## 2021-12-31 ENCOUNTER — Telehealth: Payer: BC Managed Care – PPO | Admitting: Physician Assistant

## 2022-01-14 ENCOUNTER — Encounter: Payer: Self-pay | Admitting: Physician Assistant

## 2022-01-14 ENCOUNTER — Ambulatory Visit (INDEPENDENT_AMBULATORY_CARE_PROVIDER_SITE_OTHER): Payer: BC Managed Care – PPO | Admitting: Physician Assistant

## 2022-01-14 VITALS — BP 106/78 | HR 72 | Temp 98.3°F | Resp 16 | Ht 63.0 in | Wt 211.0 lb

## 2022-01-14 DIAGNOSIS — E039 Hypothyroidism, unspecified: Secondary | ICD-10-CM | POA: Diagnosis not present

## 2022-01-14 DIAGNOSIS — R5383 Other fatigue: Secondary | ICD-10-CM

## 2022-01-14 DIAGNOSIS — E6609 Other obesity due to excess calories: Secondary | ICD-10-CM

## 2022-01-14 DIAGNOSIS — E782 Mixed hyperlipidemia: Secondary | ICD-10-CM | POA: Diagnosis not present

## 2022-01-14 DIAGNOSIS — I10 Essential (primary) hypertension: Secondary | ICD-10-CM | POA: Diagnosis not present

## 2022-01-14 DIAGNOSIS — Z6837 Body mass index (BMI) 37.0-37.9, adult: Secondary | ICD-10-CM

## 2022-01-14 MED ORDER — BUPROPION HCL ER (XL) 150 MG PO TB24: 150.0000 mg | ORAL_TABLET | Freq: Every day | ORAL | 1 refills | Status: DC

## 2022-01-14 MED ORDER — TOPIRAMATE 25 MG PO TABS: 25.0000 mg | ORAL_TABLET | Freq: Two times a day (BID) | ORAL | 1 refills | Status: DC

## 2022-01-14 NOTE — Progress Notes (Signed)
[facility] [place], [place] [address]  Internal MEDICINE  Office Visit Note  Patient Name: Miranda Briggs  [id number]  [id number]  Date of Service: 01/14/2022  Chief Complaint  Patient presents with   Follow-up   Hypertension   Gastroesophageal Reflux    HPI Pt is here for routine follow up -Seeing endocrinology and they are following her thyroid -she has no concerns today -She did have covid and flu near the holidays but is feeling better now -Due for fasting labs prior to CPE next visit -Did discuss borderline low BP in office today, but states she hasn't eaten or had much to drink today and will monitor it to see if BP med still needed. Denies any dizziness or lightheadedness.  Current Medication: Outpatient Encounter Medications as of 01/14/2022  Medication Sig   acetaminophen (TYLENOL) 500 MG tablet Take 1,000 mg by mouth every 6 (six) hours as needed for moderate pain or headache.   bisoprolol-hydrochlorothiazide (ZIAC) 5-6.25 MG tablet TAKE 1 TABLET BY MOUTH EVERY DAY   chlorpheniramine-HYDROcodone (TUSSIONEX) 10-8 MG/5ML Take 5 mLs by mouth every 12 (twelve) hours as needed for cough.   cholestyramine (QUESTRAN) 4 g packet Take 1 packet (4 g total) by mouth 3 (three) times daily with meals.   levothyroxine (SYNTHROID) 50 MCG tablet Take 1 tablet (50 mcg total) by mouth daily before breakfast.   Multiple Vitamin (MULTIVITAMIN WITH MINERALS) TABS tablet Take 1 tablet by mouth every evening.   predniSONE (DELTASONE) 10 MG tablet Take one tab 3 x day for 3 days, then take one tab 2 x a day for 3 days and then take one tab a day for 3 days for copd   [DISCONTINUED] buPROPion (WELLBUTRIN XL) 150 MG 24 hr tablet Take 1 tablet (150 mg total) by mouth daily.   [DISCONTINUED] topiramate (TOPAMAX) 25 MG tablet Take 1 tablet (25 mg total) by mouth 2 (two) times daily.   buPROPion (WELLBUTRIN XL) 150 MG 24 hr tablet Take 1 tablet (150 mg total) by mouth  daily.   topiramate (TOPAMAX) 25 MG tablet Take 1 tablet (25 mg total) by mouth 2 (two) times daily.   No facility-administered encounter medications on file as of 01/14/2022.    Surgical History: Past Surgical History:  Procedure Laterality Date   BILATERAL SALPINGECTOMY Bilateral 12/11/2018   Procedure: BILATERAL SALPINGECTOMY;  Surgeon: Schermerhorn, Gwen Her, MD;  Location: [facility];  Service: Gynecology;  Laterality: Bilateral;   CHOLECYSTECTOMY N/A 12/03/2015   Procedure: LAPAROSCOPIC CHOLECYSTECTOMY WITH INTRAOPERATIVE CHOLANGIOGRAM;  Surgeon: Christene Lye, MD;  Location: [facility];  Service: General;  Laterality: N/A;   TONSILLECTOMY     VAGINAL HYSTERECTOMY N/A 12/11/2018   Procedure: HYSTERECTOMY VAGINAL;  Surgeon: Boykin Nearing, MD;  Location: [facility];  Service: Gynecology;  Laterality: N/A;   WISDOM TOOTH EXTRACTION      Medical History: Past Medical History:  Diagnosis Date   GERD (gastroesophageal reflux disease)    Hypertension    Hypothyroidism    Thyroid disease     Family History: Family History  Problem Relation Age of Onset   Diabetes Father    Hyperthyroidism Father    Hypertension Mother     Social History   Socioeconomic History   Marital status: Married    Spouse name: Not on file   Number of children: Not on file   Years of education: Not on file   Highest education level: Not on file  Occupational History   Not  on file  Tobacco Use   Smoking status: Never   Smokeless tobacco: Never  Vaping Use   Vaping Use: Never used  Substance and Sexual Activity   Alcohol use: Yes    Comment: OCC WINE   Drug use: No   Sexual activity: Not on file  Other Topics Concern   Not on file  Social History Narrative   Not on file   Social Determinants of Health   Financial Resource Strain: Not on file  Food Insecurity: Not on file  Transportation Needs: Not on file  Physical Activity: Not on file  Stress: Not on file  Social  Connections: Not on file  Intimate Partner Violence: Not on file      Review of Systems  Constitutional:  Negative for chills, fatigue and unexpected weight change.  HENT:  Negative for congestion, rhinorrhea, sneezing and sore throat.   Eyes:  Negative for redness.  Respiratory:  Negative for cough, chest tightness and shortness of breath.   Cardiovascular:  Negative for chest pain and palpitations.  Gastrointestinal:  Negative for abdominal pain, constipation, diarrhea, nausea and vomiting.  Genitourinary:  Negative for dysuria and frequency.  Musculoskeletal:  Negative for arthralgias, back pain, joint swelling and neck pain.  Skin:  Negative for rash.  Neurological: Negative.  Negative for tremors and numbness.  Hematological:  Negative for adenopathy. Does not bruise/bleed easily.  Psychiatric/Behavioral:  Negative for behavioral problems (Depression), sleep disturbance and suicidal ideas. The patient is not nervous/anxious.     Vital Signs: BP 106/78   Pulse 72   Temp 98.3 F (36.8 C)   Resp 16   Ht '5\' 3"'$  (1.6 m)   Wt 211 lb (95.7 kg)   SpO2 99%   BMI 37.38 kg/m    Physical Exam Vitals and nursing note reviewed.  Constitutional:      General: She is not in acute distress.    Appearance: Normal appearance. She is obese. She is not ill-appearing.  HENT:     Head: Normocephalic and atraumatic.  Eyes:     Extraocular Movements: Extraocular movements intact.     Pupils: Pupils are equal, round, and reactive to light.  Cardiovascular:     Rate and Rhythm: Normal rate and regular rhythm.  Pulmonary:     Effort: Pulmonary effort is normal. No respiratory distress.  Musculoskeletal:        General: Normal range of motion.  Skin:    General: Skin is warm and dry.  Neurological:     Mental Status: She is alert and oriented to person, place, and time.     Cranial Nerves: No cranial nerve deficit.     Coordination: Coordination normal.     Gait: Gait normal.   Psychiatric:        Mood and Affect: Mood normal.        Behavior: Behavior normal.        Assessment/Plan: 1. Essential hypertension, benign Continue current medication and monitor, if dropping may stop BP med  2. Acquired hypothyroidism Followed by endocrinology  3. Mixed hyperlipidemia - Lipid Panel With LDL/HDL Ratio  4. Other fatigue - Lipid Panel With LDL/HDL Ratio - Comprehensive metabolic panel - CBC w/Diff/Platelet  5. Class 2 obesity due to excess calories without serious comorbidity with body mass index (BMI) of 37.0 to 37.9 in adult Down 7lbs since last visit, continue current medications and working on diet and exercise - buPROPion (WELLBUTRIN XL) 150 MG 24 hr tablet; Take 1 tablet (150  mg total) by mouth daily.  Dispense: 90 tablet; Refill: 1 - topiramate (TOPAMAX) 25 MG tablet; Take 1 tablet (25 mg total) by mouth 2 (two) times daily.  Dispense: 180 tablet; Refill: 1   General Counseling: Celita verbalizes understanding of the findings of todays visit and agrees with plan of treatment. I have discussed any further diagnostic evaluation that may be needed or ordered today. We also reviewed her medications today. she has been encouraged to call the office with any questions or concerns that should arise related to todays visit.    Orders Placed This Encounter  Procedures   Lipid Panel With LDL/HDL Ratio   Comprehensive metabolic panel   CBC w/Diff/Platelet    Meds ordered this encounter  Medications   buPROPion (WELLBUTRIN XL) 150 MG 24 hr tablet    Sig: Take 1 tablet (150 mg total) by mouth daily.    Dispense:  90 tablet    Refill:  1   topiramate (TOPAMAX) 25 MG tablet    Sig: Take 1 tablet (25 mg total) by mouth 2 (two) times daily.    Dispense:  180 tablet    Refill:  1    This patient was seen by Drema Dallas, PA-C in collaboration with Dr. Clayborn Bigness as a part of collaborative care agreement.   Total time spent:30 Minutes Time spent  includes review of chart, medications, test results, and follow up plan with the patient.      Dr Lavera Guise Internal medicine

## 2022-02-08 DIAGNOSIS — E039 Hypothyroidism, unspecified: Secondary | ICD-10-CM | POA: Diagnosis not present

## 2022-04-13 ENCOUNTER — Ambulatory Visit (INDEPENDENT_AMBULATORY_CARE_PROVIDER_SITE_OTHER): Payer: BC Managed Care – PPO | Admitting: Nurse Practitioner

## 2022-04-13 ENCOUNTER — Encounter: Payer: Self-pay | Admitting: Nurse Practitioner

## 2022-04-13 VITALS — BP 129/83 | HR 82 | Temp 98.3°F | Resp 16 | Ht 63.0 in | Wt 213.0 lb

## 2022-04-13 DIAGNOSIS — B9689 Other specified bacterial agents as the cause of diseases classified elsewhere: Secondary | ICD-10-CM

## 2022-04-13 DIAGNOSIS — J028 Acute pharyngitis due to other specified organisms: Secondary | ICD-10-CM

## 2022-04-13 MED ORDER — DOXYCYCLINE HYCLATE 100 MG PO TABS: 100.0000 mg | ORAL_TABLET | Freq: Two times a day (BID) | ORAL | 0 refills | Status: AC

## 2022-04-13 NOTE — Progress Notes (Signed)
[facility] [address] [place], [place] [address]  Internal MEDICINE  Office Visit Note  Patient Name: Miranda Briggs  [id number]  [id number]  Date of Service: 04/13/2022  Chief Complaint  Patient presents with   Acute Visit    Cough, wheezing, sore throat, sore chest     HPI Miranda Briggs presents for an acute sick visit for symptoms of URI.  Symptoms started Over 1 week ago Cough, chest tightness, sore chest, yellow sputum, a little bit of SOB, wheezing No significant sinus symptoms per patient.  Negative for COVID   Current Medication:  Outpatient Encounter Medications as of 04/13/2022  Medication Sig   acetaminophen (TYLENOL) 500 MG tablet Take 1,000 mg by mouth every 6 (six) hours as needed for moderate pain or headache.   bisoprolol-hydrochlorothiazide (ZIAC) 5-6.25 MG tablet TAKE 1 TABLET BY MOUTH EVERY DAY   buPROPion (WELLBUTRIN XL) 150 MG 24 hr tablet Take 1 tablet (150 mg total) by mouth daily.   chlorpheniramine-HYDROcodone (TUSSIONEX) 10-8 MG/5ML Take 5 mLs by mouth every 12 (twelve) hours as needed for cough.   cholestyramine (QUESTRAN) 4 g packet Take 1 packet (4 g total) by mouth 3 (three) times daily with meals.   doxycycline (VIBRA-TABS) 100 MG tablet Take 1 tablet (100 mg total) by mouth 2 (two) times daily for 10 days. Take with food   levothyroxine (SYNTHROID) 50 MCG tablet Take 1 tablet (50 mcg total) by mouth daily before breakfast.   Multiple Vitamin (MULTIVITAMIN WITH MINERALS) TABS tablet Take 1 tablet by mouth every evening.   predniSONE (DELTASONE) 10 MG tablet Take one tab 3 x day for 3 days, then take one tab 2 x a day for 3 days and then take one tab a day for 3 days for copd   topiramate (TOPAMAX) 25 MG tablet Take 1 tablet (25 mg total) by mouth 2 (two) times daily.   No facility-administered encounter medications on file as of 04/13/2022.      Medical History: Past Medical History:  Diagnosis Date   GERD (gastroesophageal reflux  disease)    Hypertension    Hypothyroidism    Thyroid disease      Vital Signs: BP 129/83   Pulse 82   Temp 98.3 F (36.8 C)   Resp 16   Ht 5\' 3"  (1.6 m)   Wt 213 lb (96.6 kg)   SpO2 99%   BMI 37.73 kg/m    Review of Systems  Constitutional:  Positive for fatigue. Negative for chills and fever.  Respiratory:  Positive for cough, chest tightness, shortness of breath and wheezing.   Cardiovascular: Negative.  Negative for chest pain and palpitations.  Gastrointestinal:  Positive for diarrhea. Negative for nausea and vomiting.  Neurological:  Positive for headaches.    Physical Exam Vitals reviewed.  Constitutional:      General: She is not in acute distress.    Appearance: Normal appearance. She is obese. She is ill-appearing.  HENT:     Head: Normocephalic and atraumatic.     Right Ear: Tympanic membrane normal.     Left Ear: Tympanic membrane normal.     Nose: Mucosal edema and congestion present.     Right Turbinates: Swollen and pale.     Left Turbinates: Swollen and pale.     Right Sinus: No maxillary sinus tenderness or frontal sinus tenderness.     Left Sinus: No maxillary sinus tenderness or frontal sinus tenderness.     Mouth/Throat:     Lips:  Pink.     Mouth: Mucous membranes are moist.     Pharynx: Uvula midline. Posterior oropharyngeal erythema present.  Eyes:     Pupils: Pupils are equal, round, and reactive to light.  Cardiovascular:     Rate and Rhythm: Normal rate and regular rhythm.  Pulmonary:     Effort: Pulmonary effort is normal. No respiratory distress.     Breath sounds: Examination of the right-lower field reveals decreased breath sounds. Examination of the left-lower field reveals decreased breath sounds. Decreased breath sounds present.  Neurological:     Mental Status: She is alert and oriented to person, place, and time.  Psychiatric:        Mood and Affect: Mood normal.        Behavior: Behavior normal.        Assessment/Plan: 1. Acute bacterial pharyngitis Antibiotic treatment prescribed. May take OTC cough medication if desired.  - doxycycline (VIBRA-TABS) 100 MG tablet; Take 1 tablet (100 mg total) by mouth 2 (two) times daily for 10 days. Take with food  Dispense: 20 tablet; Refill: 0   General Counseling: Miranda Briggs verbalizes understanding of the findings of todays visit and agrees with plan of treatment. I have discussed any further diagnostic evaluation that may be needed or ordered today. We also reviewed her medications today. she has been encouraged to call the office with any questions or concerns that should arise related to todays visit.    Counseling:    No orders of the defined types were placed in this encounter.   Meds ordered this encounter  Medications   doxycycline (VIBRA-TABS) 100 MG tablet    Sig: Take 1 tablet (100 mg total) by mouth 2 (two) times daily for 10 days. Take with food    Dispense:  20 tablet    Refill:  0    Return if symptoms worsen or fail to improve.  [place] Controlled Substance Database was reviewed by me for overdose risk score (ORS)  Time spent:20 Minutes Time spent with patient included reviewing progress notes, labs, imaging studies, and discussing plan for follow up.   This patient was seen by Sallyanne Kuster, FNP-C in collaboration with Dr. Beverely Risen as a part of collaborative care agreement.  Savior Himebaugh R. Tedd Sias, MSN, FNP-C Internal Medicine

## 2022-05-05 DIAGNOSIS — E782 Mixed hyperlipidemia: Secondary | ICD-10-CM | POA: Diagnosis not present

## 2022-05-05 DIAGNOSIS — R5383 Other fatigue: Secondary | ICD-10-CM | POA: Diagnosis not present

## 2022-05-06 LAB — CBC WITH DIFFERENTIAL/PLATELET
Basophils Absolute: 0.1 10*3/uL (ref 0.0–0.2)
Basos: 1 %
EOS (ABSOLUTE): 0.2 10*3/uL (ref 0.0–0.4)
Eos: 2 %
Hematocrit: 42.2 % (ref 34.0–46.6)
Hemoglobin: 14.8 g/dL (ref 11.1–15.9)
Immature Grans (Abs): 0 10*3/uL (ref 0.0–0.1)
Immature Granulocytes: 0 %
Lymphocytes Absolute: 2.6 10*3/uL (ref 0.7–3.1)
Lymphs: 37 %
MCH: 33 pg (ref 26.6–33.0)
MCHC: 35.1 g/dL (ref 31.5–35.7)
MCV: 94 fL (ref 79–97)
Monocytes Absolute: 0.6 10*3/uL (ref 0.1–0.9)
Monocytes: 8 %
Neutrophils Absolute: 3.6 10*3/uL (ref 1.4–7.0)
Neutrophils: 52 %
Platelets: 363 10*3/uL (ref 150–450)
RBC: 4.48 x10E6/uL (ref 3.77–5.28)
RDW: 11.8 % (ref 11.7–15.4)
WBC: 7 10*3/uL (ref 3.4–10.8)

## 2022-05-06 LAB — COMPREHENSIVE METABOLIC PANEL
ALT: 36 IU/L — ABNORMAL HIGH (ref 0–32)
AST: 28 IU/L (ref 0–40)
Albumin/Globulin Ratio: 1.8 (ref 1.2–2.2)
Albumin: 4.4 g/dL (ref 3.9–4.9)
Alkaline Phosphatase: 86 IU/L (ref 44–121)
BUN/Creatinine Ratio: 15 (ref 9–23)
BUN: 14 mg/dL (ref 6–20)
Bilirubin Total: 0.7 mg/dL (ref 0.0–1.2)
CO2: 23 mmol/L (ref 20–29)
Calcium: 9.9 mg/dL (ref 8.7–10.2)
Chloride: 102 mmol/L (ref 96–106)
Creatinine, Ser: 0.95 mg/dL (ref 0.57–1.00)
Globulin, Total: 2.5 g/dL (ref 1.5–4.5)
Glucose: 94 mg/dL (ref 70–99)
Potassium: 4.3 mmol/L (ref 3.5–5.2)
Sodium: 140 mmol/L (ref 134–144)
Total Protein: 6.9 g/dL (ref 6.0–8.5)
eGFR: 82 mL/min/{1.73_m2} (ref >59)

## 2022-05-06 LAB — LIPID PANEL WITH LDL/HDL RATIO
Cholesterol, Total: 172 mg/dL (ref 100–199)
HDL: 48 mg/dL (ref >39)
LDL Chol Calc (NIH): 99 mg/dL (ref 0–99)
LDL/HDL Ratio: 2.1 ratio (ref 0.0–3.2)
Triglycerides: 141 mg/dL (ref 0–149)
VLDL Cholesterol Cal: 25 mg/dL (ref 5–40)

## 2022-05-07 ENCOUNTER — Ambulatory Visit (INDEPENDENT_AMBULATORY_CARE_PROVIDER_SITE_OTHER): Payer: BC Managed Care – PPO | Admitting: Physician Assistant

## 2022-05-07 ENCOUNTER — Encounter: Payer: Self-pay | Admitting: Physician Assistant

## 2022-05-07 VITALS — BP 116/78 | HR 83 | Temp 98.3°F | Resp 16 | Ht 63.0 in | Wt 213.2 lb

## 2022-05-07 DIAGNOSIS — I10 Essential (primary) hypertension: Secondary | ICD-10-CM

## 2022-05-07 DIAGNOSIS — E6609 Other obesity due to excess calories: Secondary | ICD-10-CM | POA: Diagnosis not present

## 2022-05-07 DIAGNOSIS — E66812 Obesity, class 2: Secondary | ICD-10-CM

## 2022-05-07 DIAGNOSIS — R300 Dysuria: Secondary | ICD-10-CM | POA: Diagnosis not present

## 2022-05-07 DIAGNOSIS — E039 Hypothyroidism, unspecified: Secondary | ICD-10-CM | POA: Diagnosis not present

## 2022-05-07 DIAGNOSIS — Z0001 Encounter for general adult medical examination with abnormal findings: Secondary | ICD-10-CM | POA: Diagnosis not present

## 2022-05-07 DIAGNOSIS — Z6837 Body mass index (BMI) 37.0-37.9, adult: Secondary | ICD-10-CM

## 2022-05-07 MED ORDER — BUPROPION HCL ER (XL) 150 MG PO TB24: 150.0000 mg | ORAL_TABLET | Freq: Every day | ORAL | 1 refills | Status: DC

## 2022-05-07 MED ORDER — TOPIRAMATE 25 MG PO TABS: 25.0000 mg | ORAL_TABLET | Freq: Two times a day (BID) | ORAL | 1 refills | Status: DC

## 2022-05-07 NOTE — Progress Notes (Signed)
[facility] [address] [place], [place] [address]  Internal MEDICINE  Office Visit Note  Patient Name: Miranda Briggs  [id number]  [id number]  Date of Service: 05/14/2022  Chief Complaint  Patient presents with   Hypertension     HPI Pt is here for routine health maintenance examination and is doing well -reviewed labs, slightly elevated ALT again, but otherwise normal -Stopped her BP med after low readings, and at home since stopping has been 120s/80s. Will discontinue from list as it is stable in office as well -Interested in switching to have thyroid managed here since well controlled for years. Taking daily plus extra 1/2 tab 2x per week. May request refill when needed -Seeing GYN for pelvic exams, did already have a hysterectomy but had precancerous cells previously so she is still evaluated  Current Medication: Outpatient Encounter Medications as of 05/07/2022  Medication Sig   acetaminophen (TYLENOL) 500 MG tablet Take 1,000 mg by mouth every 6 (six) hours as needed for moderate pain or headache.   cholestyramine (QUESTRAN) 4 g packet Take 1 packet (4 g total) by mouth 3 (three) times daily with meals.   levothyroxine (SYNTHROID) 50 MCG tablet Take 1 tablet (50 mcg total) by mouth daily before breakfast.   Multiple Vitamin (MULTIVITAMIN WITH MINERALS) TABS tablet Take 1 tablet by mouth every evening.   [DISCONTINUED] bisoprolol-hydrochlorothiazide (ZIAC) 5-6.25 MG tablet TAKE 1 TABLET BY MOUTH EVERY DAY   [DISCONTINUED] buPROPion (WELLBUTRIN XL) 150 MG 24 hr tablet Take 1 tablet (150 mg total) by mouth daily.   [DISCONTINUED] chlorpheniramine-HYDROcodone (TUSSIONEX) 10-8 MG/5ML Take 5 mLs by mouth every 12 (twelve) hours as needed for cough.   [DISCONTINUED] predniSONE (DELTASONE) 10 MG tablet Take one tab 3 x day for 3 days, then take one tab 2 x a day for 3 days and then take one tab a day for 3 days for copd   [DISCONTINUED] topiramate (TOPAMAX) 25 MG  tablet Take 1 tablet (25 mg total) by mouth 2 (two) times daily.   buPROPion (WELLBUTRIN XL) 150 MG 24 hr tablet Take 1 tablet (150 mg total) by mouth daily.   topiramate (TOPAMAX) 25 MG tablet Take 1 tablet (25 mg total) by mouth 2 (two) times daily.   No facility-administered encounter medications on file as of 05/07/2022.    Surgical History: Past Surgical History:  Procedure Laterality Date   BILATERAL SALPINGECTOMY Bilateral 12/11/2018   Procedure: BILATERAL SALPINGECTOMY;  Surgeon: Schermerhorn, Ihor Austin, MD;  Location: [facility] ORS;  Service: Gynecology;  Laterality: Bilateral;   CHOLECYSTECTOMY N/A 12/03/2015   Procedure: LAPAROSCOPIC CHOLECYSTECTOMY WITH INTRAOPERATIVE CHOLANGIOGRAM;  Surgeon: Kieth Brightly, MD;  Location: [facility] ORS;  Service: General;  Laterality: N/A;   TONSILLECTOMY     VAGINAL HYSTERECTOMY N/A 12/11/2018   Procedure: HYSTERECTOMY VAGINAL;  Surgeon: Suzy Bouchard, MD;  Location: [facility] ORS;  Service: Gynecology;  Laterality: N/A;   WISDOM TOOTH EXTRACTION      Medical History: Past Medical History:  Diagnosis Date   GERD (gastroesophageal reflux disease)    Hypertension    Hypothyroidism    Thyroid disease     Family History: Family History  Problem Relation Age of Onset   Diabetes Father    Hyperthyroidism Father    Hypertension Mother       Review of Systems  Constitutional:  Negative for chills, fatigue and unexpected weight change.  HENT:  Negative for congestion, rhinorrhea, sneezing and sore throat.   Eyes:  Negative for redness.  Respiratory:  Negative for cough, chest tightness and shortness of breath.   Cardiovascular:  Negative for chest pain and palpitations.  Gastrointestinal:  Negative for abdominal pain, constipation, diarrhea, nausea and vomiting.  Genitourinary:  Negative for dysuria and frequency.  Musculoskeletal:  Negative for arthralgias, back pain, joint swelling and neck pain.  Skin:  Negative for rash.   Neurological: Negative.  Negative for tremors and numbness.  Hematological:  Negative for adenopathy. Does not bruise/bleed easily.  Psychiatric/Behavioral:  Negative for behavioral problems (Depression), sleep disturbance and suicidal ideas. The patient is not nervous/anxious.      Vital Signs: BP 116/78 Comment: 125/90  Pulse 83   Temp 98.3 F (36.8 C)   Resp 16   Ht 5\' 3"  (1.6 m)   Wt 213 lb 3.2 oz (96.7 kg)   SpO2 99%   BMI 37.77 kg/m    Physical Exam Vitals and nursing note reviewed.  Constitutional:      General: She is not in acute distress.    Appearance: Normal appearance. She is obese. She is not ill-appearing.  HENT:     Head: Normocephalic and atraumatic.  Eyes:     Extraocular Movements: Extraocular movements intact.     Pupils: Pupils are equal, round, and reactive to light.  Cardiovascular:     Rate and Rhythm: Normal rate and regular rhythm.  Pulmonary:     Effort: Pulmonary effort is normal. No respiratory distress.  Abdominal:     General: Abdomen is flat.     Palpations: Abdomen is soft.     Tenderness: There is no abdominal tenderness.  Musculoskeletal:        General: Normal range of motion.  Skin:    General: Skin is warm and dry.  Neurological:     Mental Status: She is alert and oriented to person, place, and time.     Cranial Nerves: No cranial nerve deficit.     Coordination: Coordination normal.     Gait: Gait normal.  Psychiatric:        Mood and Affect: Mood normal.        Behavior: Behavior normal.      LABS: Recent Results (from the past 2160 hour(s))  Lipid Panel With LDL/HDL Ratio     Status: None   Collection Time: 05/05/22  8:58 AM  Result Value Ref Range   Cholesterol, Total 172 100 - 199 mg/dL   Triglycerides 161 0 - 149 mg/dL   HDL 48 >09 mg/dL   VLDL Cholesterol Cal 25 5 - 40 mg/dL   LDL Chol Calc (NIH) 99 0 - 99 mg/dL   LDL/HDL Ratio 2.1 0.0 - 3.2 ratio    Comment:                                     LDL/HDL  Ratio                                             Men  Women                               1/2 Avg.Risk  1.0    1.5  Avg.Risk  3.6    3.2                                2X Avg.Risk  6.2    5.0                                3X Avg.Risk  8.0    6.1   Comprehensive metabolic panel     Status: Abnormal   Collection Time: 05/05/22  8:58 AM  Result Value Ref Range   Glucose 94 70 - 99 mg/dL   BUN 14 6 - 20 mg/dL   Creatinine, Ser 1.61 0.57 - 1.00 mg/dL   eGFR 82 >09 UE/AVW/0.98   BUN/Creatinine Ratio 15 9 - 23   Sodium 140 134 - 144 mmol/L   Potassium 4.3 3.5 - 5.2 mmol/L   Chloride 102 96 - 106 mmol/L   CO2 23 20 - 29 mmol/L   Calcium 9.9 8.7 - 10.2 mg/dL   Total Protein 6.9 6.0 - 8.5 g/dL   Albumin 4.4 3.9 - 4.9 g/dL   Globulin, Total 2.5 1.5 - 4.5 g/dL   Albumin/Globulin Ratio 1.8 1.2 - 2.2   Bilirubin Total 0.7 0.0 - 1.2 mg/dL   Alkaline Phosphatase 86 44 - 121 IU/L   AST 28 0 - 40 IU/L   ALT 36 (H) 0 - 32 IU/L  CBC w/Diff/Platelet     Status: None   Collection Time: 05/05/22  8:58 AM  Result Value Ref Range   WBC 7.0 3.4 - 10.8 x10E3/uL   RBC 4.48 3.77 - 5.28 x10E6/uL   Hemoglobin 14.8 11.1 - 15.9 g/dL   Hematocrit 11.9 14.7 - 46.6 %   MCV 94 79 - 97 fL   MCH 33.0 26.6 - 33.0 pg   MCHC 35.1 31.5 - 35.7 g/dL   RDW 82.9 56.2 - 13.0 %   Platelets 363 150 - 450 x10E3/uL   Neutrophils 52 Not Estab. %   Lymphs 37 Not Estab. %   Monocytes 8 Not Estab. %   Eos 2 Not Estab. %   Basos 1 Not Estab. %   Neutrophils Absolute 3.6 1.4 - 7.0 x10E3/uL   Lymphocytes Absolute 2.6 0.7 - 3.1 x10E3/uL   Monocytes Absolute 0.6 0.1 - 0.9 x10E3/uL   EOS (ABSOLUTE) 0.2 0.0 - 0.4 x10E3/uL   Basophils Absolute 0.1 0.0 - 0.2 x10E3/uL   Immature Granulocytes 0 Not Estab. %   Immature Grans (Abs) 0.0 0.0 - 0.1 x10E3/uL  UA/M w/rflx Culture, Routine     Status: None   Collection Time: 05/07/22 11:12 AM   Specimen: Urine   Urine  Result Value Ref Range    Specific Gravity, UA 1.009 1.005 - 1.030   pH, UA 6.5 5.0 - 7.5   Color, UA Yellow Yellow   Appearance Ur Clear Clear   Leukocytes,UA Negative Negative   Protein,UA Negative Negative/Trace   Glucose, UA Negative Negative   Ketones, UA Negative Negative   RBC, UA Negative Negative   Bilirubin, UA Negative Negative   Urobilinogen, Ur 0.2 0.2 - 1.0 mg/dL   Nitrite, UA Negative Negative   Microscopic Examination Comment     Comment: Microscopic follows if indicated.   Microscopic Examination See below:     Comment: Microscopic was indicated and was performed.   Urinalysis Reflex Comment     Comment: This  specimen will not reflex to a Urine Culture.  Microscopic Examination     Status: None   Collection Time: 05/07/22 11:12 AM   Urine  Result Value Ref Range   WBC, UA None seen 0 - 5 /hpf   RBC, Urine None seen 0 - 2 /hpf   Epithelial Cells (non renal) None seen 0 - 10 /hpf   Casts None seen None seen /lpf   Bacteria, UA None seen None seen/Few        Assessment/Plan: 1. Encounter for general adult medical examination with abnormal findings CPE performed, labs reviewed, UTD on PHM  2. Essential hypertension, benign Well controlled without medication now, continue to monitor  3. Acquired hypothyroidism Stable, may call when refill due  4. Class 2 obesity due to excess calories without serious comorbidity with body mass index (BMI) of 37.0 to 37.9 in adult May continue wellbutrin and topamax and work on diet and exercise - buPROPion (WELLBUTRIN XL) 150 MG 24 hr tablet; Take 1 tablet (150 mg total) by mouth daily.  Dispense: 90 tablet; Refill: 1 - topiramate (TOPAMAX) 25 MG tablet; Take 1 tablet (25 mg total) by mouth 2 (two) times daily.  Dispense: 180 tablet; Refill: 1  5. Dysuria - UA/M w/rflx Culture, Routine   General Counseling: Berenize verbalizes understanding of the findings of todays visit and agrees with plan of treatment. I have discussed any further diagnostic  evaluation that may be needed or ordered today. We also reviewed her medications today. she has been encouraged to call the office with any questions or concerns that should arise related to todays visit.    Counseling:    Orders Placed This Encounter  Procedures   Microscopic Examination   UA/M w/rflx Culture, Routine    Meds ordered this encounter  Medications   buPROPion (WELLBUTRIN XL) 150 MG 24 hr tablet    Sig: Take 1 tablet (150 mg total) by mouth daily.    Dispense:  90 tablet    Refill:  1   topiramate (TOPAMAX) 25 MG tablet    Sig: Take 1 tablet (25 mg total) by mouth 2 (two) times daily.    Dispense:  180 tablet    Refill:  1    This patient was seen by Lynn Ito, PA-C in collaboration with Dr. Beverely Risen as a part of collaborative care agreement.  Total time spent:35 Minutes  Time spent includes review of chart, medications, test results, and follow up plan with the patient.     Lyndon Code, MD  Internal Medicine

## 2022-05-08 LAB — UA/M W/RFLX CULTURE, ROUTINE
Bilirubin, UA: NEGATIVE
Glucose, UA: NEGATIVE
Ketones, UA: NEGATIVE
Leukocytes,UA: NEGATIVE
Nitrite, UA: NEGATIVE
Protein,UA: NEGATIVE
RBC, UA: NEGATIVE
Specific Gravity, UA: 1.009 (ref 1.005–1.030)
Urobilinogen, Ur: 0.2 mg/dL (ref 0.2–1.0)
pH, UA: 6.5 (ref 5.0–7.5)

## 2022-05-08 LAB — MICROSCOPIC EXAMINATION
Bacteria, UA: NONE SEEN
Casts: NONE SEEN /lpf
Epithelial Cells (non renal): NONE SEEN /hpf (ref 0–10)
RBC, Urine: NONE SEEN /hpf (ref 0–2)
WBC, UA: NONE SEEN /hpf (ref 0–5)

## 2022-09-29 ENCOUNTER — Ambulatory Visit (INDEPENDENT_AMBULATORY_CARE_PROVIDER_SITE_OTHER): Payer: BC Managed Care – PPO | Admitting: Nurse Practitioner

## 2022-09-29 ENCOUNTER — Encounter: Payer: Self-pay | Admitting: Nurse Practitioner

## 2022-09-29 VITALS — BP 122/88 | HR 96 | Temp 98.3°F | Resp 16 | Ht 63.0 in | Wt 217.4 lb

## 2022-09-29 DIAGNOSIS — L299 Pruritus, unspecified: Secondary | ICD-10-CM | POA: Diagnosis not present

## 2022-09-29 DIAGNOSIS — N3001 Acute cystitis with hematuria: Secondary | ICD-10-CM

## 2022-09-29 DIAGNOSIS — N390 Urinary tract infection, site not specified: Secondary | ICD-10-CM

## 2022-09-29 DIAGNOSIS — R300 Dysuria: Secondary | ICD-10-CM

## 2022-09-29 DIAGNOSIS — R319 Hematuria, unspecified: Secondary | ICD-10-CM | POA: Diagnosis not present

## 2022-09-29 LAB — POCT URINALYSIS DIPSTICK
Bilirubin, UA: NEGATIVE
Glucose, UA: NEGATIVE
Leukocytes, UA: NEGATIVE
Nitrite, UA: NEGATIVE
Protein, UA: NEGATIVE
Spec Grav, UA: <=1.005 — AB (ref 1.010–1.025)
Urobilinogen, UA: 0.2 E.U./dL
pH, UA: 6.5 (ref 5.0–8.0)

## 2022-09-29 MED ORDER — NITROFURANTOIN MONOHYD MACRO 100 MG PO CAPS
100.0000 mg | ORAL_CAPSULE | Freq: Two times a day (BID) | ORAL | 0 refills | Status: AC
Start: 2022-09-29 — End: 2022-10-06

## 2022-09-29 NOTE — Progress Notes (Signed)
[facility] [address] [place], [place] [address]  Internal MEDICINE  Office Visit Note  Patient Name: Miranda Briggs  [id number]  [id number]  Date of Service: 09/30/2022  Chief Complaint  Patient presents with   Acute Visit    Possible bladder infection     HPI Miranda Briggs presents for an acute sick visit for possible UTI --onset of symptoms was day before yesterdyay Freq, urg, back pain,  Urinalysis positive for trace blood, negative for leukocytes and nitrites.     Current Medication:  Outpatient Encounter Medications as of 09/29/2022  Medication Sig   acetaminophen (TYLENOL) 500 MG tablet Take 1,000 mg by mouth every 6 (six) hours as needed for moderate pain or headache.   buPROPion (WELLBUTRIN XL) 150 MG 24 hr tablet Take 1 tablet (150 mg total) by mouth daily.   cholestyramine (QUESTRAN) 4 g packet Take 1 packet (4 g total) by mouth 3 (three) times daily with meals.   levothyroxine (SYNTHROID) 50 MCG tablet Take 1 tablet (50 mcg total) by mouth daily before breakfast.   Multiple Vitamin (MULTIVITAMIN WITH MINERALS) TABS tablet Take 1 tablet by mouth every evening.   nitrofurantoin, macrocrystal-monohydrate, (MACROBID) 100 MG capsule Take 1 capsule (100 mg total) by mouth 2 (two) times daily for 7 days. Take with food   topiramate (TOPAMAX) 25 MG tablet Take 1 tablet (25 mg total) by mouth 2 (two) times daily.   No facility-administered encounter medications on file as of 09/29/2022.      Medical History: Past Medical History:  Diagnosis Date   GERD (gastroesophageal reflux disease)    Hypertension    Hypothyroidism    Thyroid disease      Vital Signs: BP 122/88   Pulse 96   Temp 98.3 F (36.8 C)   Resp 16   Ht 5\' 3"  (1.6 m)   Wt 217 lb 6.4 oz (98.6 kg)   SpO2 99%   BMI 38.51 kg/m    Review of Systems  Constitutional:  Negative for chills, fatigue and unexpected weight change.  HENT:  Negative for congestion, postnasal drip, rhinorrhea,  sneezing and sore throat.   Eyes:  Negative for redness.  Respiratory:  Negative for cough, chest tightness and shortness of breath.   Cardiovascular:  Negative for chest pain and palpitations.  Gastrointestinal:  Negative for abdominal pain, constipation, diarrhea, nausea and vomiting.  Genitourinary:  Positive for frequency, pelvic pain and urgency. Negative for dysuria.  Musculoskeletal:  Negative for arthralgias, back pain, joint swelling and neck pain.  Skin:  Negative for rash.  Neurological: Negative.  Negative for tremors and numbness.  Hematological:  Negative for adenopathy. Does not bruise/bleed easily.  Psychiatric/Behavioral:  Negative for behavioral problems (Depression), sleep disturbance and suicidal ideas. The patient is not nervous/anxious.     Physical Exam Vitals reviewed.  Constitutional:      Appearance: Normal appearance.  HENT:     Head: Normocephalic and atraumatic.     Mouth/Throat:     Mouth: Mucous membranes are moist.     Pharynx: Oropharynx is clear.  Eyes:     Pupils: Pupils are equal, round, and reactive to light.  Cardiovascular:     Rate and Rhythm: Normal rate and regular rhythm.     Heart sounds: No murmur heard. Neurological:     Mental Status: She is alert and oriented to person, place, and time.  Psychiatric:        Mood and Affect: Mood normal.  Behavior: Behavior normal.       Assessment/Plan: 1. Acute cystitis with hematuria Urine culture sent, antibiotic prescribed, take until gone. - CULTURE, URINE COMPREHENSIVE - nitrofurantoin, macrocrystal-monohydrate, (MACROBID) 100 MG capsule; Take 1 capsule (100 mg total) by mouth 2 (two) times daily for 7 days. Take with food  Dispense: 14 capsule; Refill: 0  2. Generalized pruritus Liver function panel ordered - Hepatic function panel  3. Dysuria Urine positive for blood, urine culture sent  - POCT urinalysis dipstick - CULTURE, URINE COMPREHENSIVE   General Counseling:  [name] verbalizes understanding of the findings of todays visit and agrees with plan of treatment. I have discussed any further diagnostic evaluation that may be needed or ordered today. We also reviewed her medications today. she has been encouraged to call the office with any questions or concerns that should arise related to todays visit.    Counseling:    Orders Placed This Encounter  Procedures   CULTURE, URINE COMPREHENSIVE   Hepatic function panel   POCT urinalysis dipstick    Meds ordered this encounter  Medications   nitrofurantoin, macrocrystal-monohydrate, (MACROBID) 100 MG capsule    Sig: Take 1 capsule (100 mg total) by mouth 2 (two) times daily for 7 days. Take with food    Dispense:  14 capsule    Refill:  0    Return if symptoms worsen or fail to improve.   Controlled Substance Database was reviewed by me for overdose risk score (ORS)  Time spent:30 Minutes Time spent with patient included reviewing progress notes, labs, imaging studies, and discussing plan for follow up.   This patient was seen by Sallyanne Kuster, FNP-C in collaboration with Dr. Beverely Risen as a part of collaborative care agreement.  Yi Haugan R. Tedd Sias, MSN, FNP-C Internal Medicine

## 2022-09-30 ENCOUNTER — Encounter: Payer: Self-pay | Admitting: Nurse Practitioner

## 2022-10-03 LAB — CULTURE, URINE COMPREHENSIVE

## 2022-10-20 ENCOUNTER — Encounter: Payer: Self-pay | Admitting: Nurse Practitioner

## 2022-10-20 ENCOUNTER — Telehealth (INDEPENDENT_AMBULATORY_CARE_PROVIDER_SITE_OTHER): Payer: BC Managed Care – PPO | Admitting: Nurse Practitioner

## 2022-10-20 VITALS — Resp 16 | Ht 63.0 in | Wt 211.0 lb

## 2022-10-20 DIAGNOSIS — J0100 Acute maxillary sinusitis, unspecified: Secondary | ICD-10-CM

## 2022-10-20 MED ORDER — HYDROCOD POLI-CHLORPHE POLI ER 10-8 MG/5ML PO SUER
5.0000 mL | Freq: Two times a day (BID) | ORAL | 0 refills | Status: DC | PRN
Start: 2022-10-20 — End: 2022-11-08

## 2022-10-20 MED ORDER — DOXYCYCLINE HYCLATE 100 MG PO TABS
100.0000 mg | ORAL_TABLET | Freq: Two times a day (BID) | ORAL | 0 refills | Status: AC
Start: 2022-10-20 — End: 2022-10-30

## 2022-10-20 NOTE — Progress Notes (Signed)
[facility] [address] [place], [place] [address]  Internal MEDICINE  Telephone Visit  Patient Name: Miranda Briggs  [id number]  [id number]  Date of Service: 10/20/2022  I connected with the patient at 1655 by telephone and verified the patients identity using two identifiers.   I discussed the limitations, risks, security and privacy concerns of performing an evaluation and management service by telephone and the availability of in person appointments. I also discussed with the patient that there may be a patient responsible charge related to the service.  The patient expressed understanding and agrees to proceed.    Chief Complaint  Patient presents with   Telephone Screen    Sinus infection cough . Covid test negative    Telephone Assessment    HPI [name] presents for a telehealth virtual visit for symptoms of sinusitis  Congestion, cough, sore throat, headache, sinus pain, pressure . Started on Sunday, 3 days ago.  Negative for covid     Current Medication: Outpatient Encounter Medications as of 10/20/2022  Medication Sig   acetaminophen (TYLENOL) 500 MG tablet Take 1,000 mg by mouth every 6 (six) hours as needed for moderate pain or headache.   cholestyramine (QUESTRAN) 4 g packet Take 1 packet (4 g total) by mouth 3 (three) times daily with meals.   [EXPIRED] doxycycline (VIBRA-TABS) 100 MG tablet Take 1 tablet (100 mg total) by mouth 2 (two) times daily for 10 days. Take with food   Multiple Vitamin (MULTIVITAMIN WITH MINERALS) TABS tablet Take 1 tablet by mouth every evening.   [DISCONTINUED] buPROPion (WELLBUTRIN XL) 150 MG 24 hr tablet Take 1 tablet (150 mg total) by mouth daily.   [DISCONTINUED] chlorpheniramine-HYDROcodone (TUSSIONEX) 10-8 MG/5ML Take 5 mLs by mouth every 12 (twelve) hours as needed for cough.   [DISCONTINUED] levothyroxine (SYNTHROID) 50 MCG tablet Take 1 tablet (50 mcg total) by mouth daily before breakfast.   [DISCONTINUED]  topiramate (TOPAMAX) 25 MG tablet Take 1 tablet (25 mg total) by mouth 2 (two) times daily.   No facility-administered encounter medications on file as of 10/20/2022.    Surgical History: Past Surgical History:  Procedure Laterality Date   BILATERAL SALPINGECTOMY Bilateral 12/11/2018   Procedure: BILATERAL SALPINGECTOMY;  Surgeon: Schermerhorn, Ihor Austin, MD;  Location: [facility] ORS;  Service: Gynecology;  Laterality: Bilateral;   CHOLECYSTECTOMY N/A 12/03/2015   Procedure: LAPAROSCOPIC CHOLECYSTECTOMY WITH INTRAOPERATIVE CHOLANGIOGRAM;  Surgeon: Kieth Brightly, MD;  Location: [facility] ORS;  Service: General;  Laterality: N/A;   TONSILLECTOMY     VAGINAL HYSTERECTOMY N/A 12/11/2018   Procedure: HYSTERECTOMY VAGINAL;  Surgeon: Suzy Bouchard, MD;  Location: [facility] ORS;  Service: Gynecology;  Laterality: N/A;   WISDOM TOOTH EXTRACTION      Medical History: Past Medical History:  Diagnosis Date   GERD (gastroesophageal reflux disease)    Hypertension    Hypothyroidism    Thyroid disease     Family History: Family History  Problem Relation Age of Onset   Diabetes Father    Hyperthyroidism Father    Hypertension Mother     Social History   Socioeconomic History   Marital status: Married    Spouse name: Not on file   Number of children: Not on file   Years of education: Not on file   Highest education level: Not on file  Occupational History   Not on file  Tobacco Use   Smoking status: Never   Smokeless tobacco: Never  Vaping Use   Vaping status: Never Used  Substance and Sexual Activity   Alcohol use: Yes    Comment: OCC WINE   Drug use: No   Sexual activity: Not on file  Other Topics Concern   Not on file  Social History Narrative   Not on file   Social Determinants of Health   Financial Resource Strain: Not on file  Food Insecurity: Not on file  Transportation Needs: Not on file  Physical Activity: Not on file  Stress: Not on file  Social Connections:  Not on file  Intimate Partner Violence: Not on file      Review of Systems  Constitutional:  Positive for fatigue.  HENT:  Positive for congestion, postnasal drip, rhinorrhea, sinus pressure and sore throat.   Respiratory:  Positive for cough. Negative for chest tightness, shortness of breath and wheezing.   Cardiovascular: Negative.  Negative for chest pain and palpitations.  Neurological:  Positive for headaches.    Vital Signs: Resp 16   Ht 5\' 3"  (1.6 m)   Wt 211 lb (95.7 kg)   BMI 37.38 kg/m    Observation/Objective: She is alert and oriented. No acute distress noted     Assessment/Plan: 1. Acute non-recurrent maxillary sinusitis Antibiotic and cough medication prescribed. Take antibiotic until gone.  - doxycycline (VIBRA-TABS) 100 MG tablet; Take 1 tablet (100 mg total) by mouth 2 (two) times daily for 10 days. Take with food  Dispense: 20 tablet; Refill: 0   General Counseling: [name] verbalizes understanding of the findings of today's phone visit and agrees with plan of treatment. I have discussed any further diagnostic evaluation that may be needed or ordered today. We also reviewed her medications today. she has been encouraged to call the office with any questions or concerns that should arise related to todays visit.  Return if symptoms worsen or fail to improve.   No orders of the defined types were placed in this encounter.   Meds ordered this encounter  Medications   doxycycline (VIBRA-TABS) 100 MG tablet    Sig: Take 1 tablet (100 mg total) by mouth 2 (two) times daily for 10 days. Take with food    Dispense:  20 tablet    Refill:  0   DISCONTD: chlorpheniramine-HYDROcodone (TUSSIONEX) 10-8 MG/5ML    Sig: Take 5 mLs by mouth every 12 (twelve) hours as needed for cough.    Dispense:  140 mL    Refill:  0    Time spent:10 Minutes Time spent with patient included reviewing progress notes, labs, imaging studies, and discussing plan for follow up.  [name]  Controlled Substance Database was reviewed by me for overdose risk score (ORS) if appropriate.  This patient was seen by Sallyanne Kuster, FNP-C in collaboration with Dr. Beverely Risen as a part of collaborative care agreement.  Hema Lanza R. Tedd Sias, MSN, FNP-C Internal medicine

## 2022-11-08 ENCOUNTER — Ambulatory Visit (INDEPENDENT_AMBULATORY_CARE_PROVIDER_SITE_OTHER): Payer: BC Managed Care – PPO | Admitting: Physician Assistant

## 2022-11-08 ENCOUNTER — Encounter: Payer: Self-pay | Admitting: Physician Assistant

## 2022-11-08 VITALS — BP 121/70 | HR 89 | Temp 98.3°F | Resp 16 | Ht 63.0 in | Wt 218.4 lb

## 2022-11-08 DIAGNOSIS — R5383 Other fatigue: Secondary | ICD-10-CM

## 2022-11-08 DIAGNOSIS — E039 Hypothyroidism, unspecified: Secondary | ICD-10-CM

## 2022-11-08 DIAGNOSIS — E782 Mixed hyperlipidemia: Secondary | ICD-10-CM | POA: Diagnosis not present

## 2022-11-08 DIAGNOSIS — E669 Obesity, unspecified: Secondary | ICD-10-CM | POA: Diagnosis not present

## 2022-11-08 MED ORDER — BUPROPION HCL ER (XL) 150 MG PO TB24
150.0000 mg | ORAL_TABLET | Freq: Every day | ORAL | 1 refills | Status: DC
Start: 2022-11-08 — End: 2023-11-21

## 2022-11-08 MED ORDER — LEVOTHYROXINE SODIUM 50 MCG PO TABS
ORAL_TABLET | ORAL | 1 refills | Status: DC
Start: 2022-11-08 — End: 2023-09-13

## 2022-11-08 MED ORDER — TOPIRAMATE 25 MG PO TABS
25.0000 mg | ORAL_TABLET | Freq: Two times a day (BID) | ORAL | 1 refills | Status: DC
Start: 2022-11-08 — End: 2023-05-23

## 2022-11-08 NOTE — Progress Notes (Signed)
[facility] [address] [place], [place] [address]  Internal MEDICINE  Office Visit Note  Patient Name: Miranda Briggs  [id number]  [id number]  Date of Service: 11/08/2022  Chief Complaint  Patient presents with   Follow-up   Gastroesophageal Reflux   Hypertension    HPI Pt is here for routine follow up and has no concerns today -BP still doing well without medication -Will be managing thyroid now since very stable for a long time. Taking 1.5 tabs on Wednesday and Sundays. Will send refill and plan for labs in spring as planned -does follow with GYN for pap still post hysterectomy  Current Medication: Outpatient Encounter Medications as of 11/08/2022  Medication Sig   acetaminophen (TYLENOL) 500 MG tablet Take 1,000 mg by mouth every 6 (six) hours as needed for moderate pain or headache.   cholestyramine (QUESTRAN) 4 g packet Take 1 packet (4 g total) by mouth 3 (three) times daily with meals.   Multiple Vitamin (MULTIVITAMIN WITH MINERALS) TABS tablet Take 1 tablet by mouth every evening.   [DISCONTINUED] buPROPion (WELLBUTRIN XL) 150 MG 24 hr tablet Take 1 tablet (150 mg total) by mouth daily.   [DISCONTINUED] chlorpheniramine-HYDROcodone (TUSSIONEX) 10-8 MG/5ML Take 5 mLs by mouth every 12 (twelve) hours as needed for cough.   [DISCONTINUED] levothyroxine (SYNTHROID) 50 MCG tablet Take 1 tablet (50 mcg total) by mouth daily before breakfast.   [DISCONTINUED] topiramate (TOPAMAX) 25 MG tablet Take 1 tablet (25 mg total) by mouth 2 (two) times daily.   buPROPion (WELLBUTRIN XL) 150 MG 24 hr tablet Take 1 tablet (150 mg total) by mouth daily.   levothyroxine (SYNTHROID) 50 MCG tablet Take 1 tablet by mouth daily, except for 1.5 tabs on Wed and Sun.   topiramate (TOPAMAX) 25 MG tablet Take 1 tablet (25 mg total) by mouth 2 (two) times daily.   No facility-administered encounter medications on file as of 11/08/2022.    Surgical History: Past Surgical History:   Procedure Laterality Date   BILATERAL SALPINGECTOMY Bilateral 12/11/2018   Procedure: BILATERAL SALPINGECTOMY;  Surgeon: Schermerhorn, Ihor Austin, MD;  Location: [facility];  Service: Gynecology;  Laterality: Bilateral;   CHOLECYSTECTOMY N/A 12/03/2015   Procedure: LAPAROSCOPIC CHOLECYSTECTOMY WITH INTRAOPERATIVE CHOLANGIOGRAM;  Surgeon: Kieth Brightly, MD;  Location: [facility];  Service: General;  Laterality: N/A;   TONSILLECTOMY     VAGINAL HYSTERECTOMY N/A 12/11/2018   Procedure: HYSTERECTOMY VAGINAL;  Surgeon: Suzy Bouchard, MD;  Location: [facility];  Service: Gynecology;  Laterality: N/A;   WISDOM TOOTH EXTRACTION      Medical History: Past Medical History:  Diagnosis Date   GERD (gastroesophageal reflux disease)    Hypertension    Hypothyroidism    Thyroid disease     Family History: Family History  Problem Relation Age of Onset   Diabetes Father    Hyperthyroidism Father    Hypertension Mother     Social History   Socioeconomic History   Marital status: Married    Spouse name: Not on file   Number of children: Not on file   Years of education: Not on file   Highest education level: Not on file  Occupational History   Not on file  Tobacco Use   Smoking status: Never   Smokeless tobacco: Never  Vaping Use   Vaping status: Never Used  Substance and Sexual Activity   Alcohol use: Yes    Comment: OCC WINE   Drug use: No   Sexual activity: Not on  file  Other Topics Concern   Not on file  Social History Narrative   Not on file   Social Determinants of Health   Financial Resource Strain: Not on file  Food Insecurity: Not on file  Transportation Needs: Not on file  Physical Activity: Not on file  Stress: Not on file  Social Connections: Not on file  Intimate Partner Violence: Not on file      Review of Systems  Constitutional:  Negative for chills, fatigue and unexpected weight change.  HENT:  Negative for congestion, postnasal drip,  rhinorrhea, sneezing and sore throat.   Eyes:  Negative for redness.  Respiratory:  Negative for cough, chest tightness and shortness of breath.   Cardiovascular:  Negative for chest pain and palpitations.  Gastrointestinal:  Negative for abdominal pain, constipation, diarrhea, nausea and vomiting.  Genitourinary:  Negative for dysuria and frequency.  Musculoskeletal:  Negative for arthralgias, back pain, joint swelling and neck pain.  Skin:  Negative for rash.  Neurological: Negative.  Negative for tremors and numbness.  Hematological:  Negative for adenopathy. Does not bruise/bleed easily.  Psychiatric/Behavioral:  Negative for behavioral problems (Depression), sleep disturbance and suicidal ideas. The patient is not nervous/anxious.     Vital Signs: BP 121/70   Pulse 89   Temp 98.3 F (36.8 C)   Resp 16   Ht 5\' 3"  (1.6 m)   Wt 218 lb 6.4 oz (99.1 kg)   SpO2 97%   BMI 38.69 kg/m    Physical Exam Vitals and nursing note reviewed.  Constitutional:      Appearance: Normal appearance.  HENT:     Head: Normocephalic and atraumatic.     Mouth/Throat:     Mouth: Mucous membranes are moist.     Pharynx: Oropharynx is clear.  Eyes:     Pupils: Pupils are equal, round, and reactive to light.  Cardiovascular:     Rate and Rhythm: Normal rate and regular rhythm.     Heart sounds: No murmur heard. Neurological:     Mental Status: She is alert and oriented to person, place, and time.  Psychiatric:        Mood and Affect: Mood normal.        Behavior: Behavior normal.        Assessment/Plan: 1. Acquired hypothyroidism Will send refills and plan to recheck labs in a few months - levothyroxine (SYNTHROID) 50 MCG tablet; Take 1 tablet by mouth daily, except for 1.5 tabs on Wed and Sun.  Dispense: 90 tablet; Refill: 1 - TSH + free T4  2. Obesity (BMI 30-39.9) May continue wellbutrin and topamax and will continue to work on diet and exercise - topiramate (TOPAMAX) 25 MG  tablet; Take 1 tablet (25 mg total) by mouth 2 (two) times daily.  Dispense: 180 tablet; Refill: 1 - buPROPion (WELLBUTRIN XL) 150 MG 24 hr tablet; Take 1 tablet (150 mg total) by mouth daily.  Dispense: 90 tablet; Refill: 1  3. Mixed hyperlipidemia - Lipid Panel With LDL/HDL Ratio  4. Other fatigue - TSH + free T4 - CBC w/Diff/Platelet - Comprehensive metabolic panel - Lipid Panel With LDL/HDL Ratio   General Counseling: Aniyla verbalizes understanding of the findings of todays visit and agrees with plan of treatment. I have discussed any further diagnostic evaluation that may be needed or ordered today. We also reviewed her medications today. she has been encouraged to call the office with any questions or concerns that should arise related to todays visit.  Orders Placed This Encounter  Procedures   TSH + free T4   CBC w/Diff/Platelet   Comprehensive metabolic panel   Lipid Panel With LDL/HDL Ratio    Meds ordered this encounter  Medications   topiramate (TOPAMAX) 25 MG tablet    Sig: Take 1 tablet (25 mg total) by mouth 2 (two) times daily.    Dispense:  180 tablet    Refill:  1   buPROPion (WELLBUTRIN XL) 150 MG 24 hr tablet    Sig: Take 1 tablet (150 mg total) by mouth daily.    Dispense:  90 tablet    Refill:  1   levothyroxine (SYNTHROID) 50 MCG tablet    Sig: Take 1 tablet by mouth daily, except for 1.5 tabs on Wed and Sun.    Dispense:  90 tablet    Refill:  1    This patient was seen by Lynn Ito, PA-C in collaboration with Dr. Beverely Risen as a part of collaborative care agreement.   Total time spent:30 Minutes Time spent includes review of chart, medications, test results, and follow up plan with the patient.      Dr Lyndon Code Internal medicine

## 2022-11-26 ENCOUNTER — Encounter: Payer: Self-pay | Admitting: Nurse Practitioner

## 2022-12-27 ENCOUNTER — Encounter: Payer: Self-pay | Admitting: Nurse Practitioner

## 2022-12-27 ENCOUNTER — Telehealth (INDEPENDENT_AMBULATORY_CARE_PROVIDER_SITE_OTHER): Payer: BC Managed Care – PPO | Admitting: Nurse Practitioner

## 2022-12-27 VITALS — Resp 16 | Ht 63.0 in | Wt 214.0 lb

## 2022-12-27 DIAGNOSIS — J0100 Acute maxillary sinusitis, unspecified: Secondary | ICD-10-CM

## 2022-12-27 DIAGNOSIS — R051 Acute cough: Secondary | ICD-10-CM | POA: Diagnosis not present

## 2022-12-27 MED ORDER — AZITHROMYCIN 250 MG PO TABS: ORAL_TABLET | ORAL | 0 refills | Status: AC

## 2022-12-27 NOTE — Progress Notes (Cosign Needed)
[facility] [address] [place], [place] [address]  Internal MEDICINE  Telephone Visit  Patient Name: Miranda Briggs  [id number]  [id number]  Date of Service: 12/27/2022  I connected with the patient at 1000 by telephone and verified the patients identity using two identifiers.   I discussed the limitations, risks, security and privacy concerns of performing an evaluation and management service by telephone and the availability of in person appointments. I also discussed with the patient that there may be a patient responsible charge related to the service.  The patient expressed understanding and agrees to proceed.    Chief Complaint  Patient presents with   Telephone Screen    Coughing, sore throat, chest is sore from coughing, covid test negative.   Telephone Assessment    HPI Colleen presents for a telehealth virtual visit for sore throat and cough Negative for covid Onset of symptoms was about 4 days ago Reports cough, sore throat, chest tightness.  Denies any fever or chills.  Son is sick and now she is getting sick Had doxycycline in October but had adverse side effects of nausea and upset stomach with it.    Current Medication: Outpatient Encounter Medications as of 12/27/2022  Medication Sig   acetaminophen (TYLENOL) 500 MG tablet Take 1,000 mg by mouth every 6 (six) hours as needed for moderate pain or headache.   azithromycin (ZITHROMAX) 250 MG tablet Take 2 tablets on day 1, then 1 tablet daily on days 2 through 5   buPROPion (WELLBUTRIN XL) 150 MG 24 hr tablet Take 1 tablet (150 mg total) by mouth daily.   cholestyramine (QUESTRAN) 4 g packet Take 1 packet (4 g total) by mouth 3 (three) times daily with meals.   levothyroxine (SYNTHROID) 50 MCG tablet Take 1 tablet by mouth daily, except for 1.5 tabs on Wed and Sun.   Multiple Vitamin (MULTIVITAMIN WITH MINERALS) TABS tablet Take 1 tablet by mouth every evening.   topiramate (TOPAMAX) 25 MG tablet Take  1 tablet (25 mg total) by mouth 2 (two) times daily.   No facility-administered encounter medications on file as of 12/27/2022.    Surgical History: Past Surgical History:  Procedure Laterality Date   BILATERAL SALPINGECTOMY Bilateral 12/11/2018   Procedure: BILATERAL SALPINGECTOMY;  Surgeon: Schermerhorn, Ihor Austin, MD;  Location: [facility];  Service: Gynecology;  Laterality: Bilateral;   CHOLECYSTECTOMY N/A 12/03/2015   Procedure: LAPAROSCOPIC CHOLECYSTECTOMY WITH INTRAOPERATIVE CHOLANGIOGRAM;  Surgeon: Kieth Brightly, MD;  Location: [facility];  Service: General;  Laterality: N/A;   TONSILLECTOMY     VAGINAL HYSTERECTOMY N/A 12/11/2018   Procedure: HYSTERECTOMY VAGINAL;  Surgeon: Suzy Bouchard, MD;  Location: [facility];  Service: Gynecology;  Laterality: N/A;   WISDOM TOOTH EXTRACTION      Medical History: Past Medical History:  Diagnosis Date   GERD (gastroesophageal reflux disease)    Hypertension    Hypothyroidism    Thyroid disease     Family History: Family History  Problem Relation Age of Onset   Diabetes Father    Hyperthyroidism Father    Hypertension Mother     Social History   Socioeconomic History   Marital status: Married    Spouse name: Not on file   Number of children: Not on file   Years of education: Not on file   Highest education level: Not on file  Occupational History   Not on file  Tobacco Use   Smoking status: Never   Smokeless tobacco: Never  Vaping  Use   Vaping status: Never Used  Substance and Sexual Activity   Alcohol use: Yes    Comment: OCC WINE   Drug use: No   Sexual activity: Not on file  Other Topics Concern   Not on file  Social History Narrative   Not on file   Social Drivers of Health   Financial Resource Strain: Not on file  Food Insecurity: Not on file  Transportation Needs: Not on file  Physical Activity: Not on file  Stress: Not on file  Social Connections: Not on file  Intimate Partner Violence:  Not on file      Review of Systems  Constitutional:  Positive for fatigue. Negative for chills and fever.  HENT:  Positive for congestion, postnasal drip, rhinorrhea, sinus pressure, sinus pain and sore throat.   Respiratory:  Positive for cough and chest tightness. Negative for shortness of breath and wheezing.   Cardiovascular: Negative.  Negative for chest pain and palpitations.  Neurological: Negative.  Negative for headaches.    Vital Signs: Resp 16   Ht 5\' 3"  (1.6 m)   Wt 214 lb (97.1 kg)   BMI 37.91 kg/m    Observation/Objective: She is alert and oriented. No acute distress noted.     Assessment/Plan: 1. Acute non-recurrent maxillary sinusitis (Primary) Zpak prescribed, take until gone  - azithromycin (ZITHROMAX) 250 MG tablet; Take 2 tablets on day 1, then 1 tablet daily on days 2 through 5  Dispense: 6 tablet; Refill: 0  2. Acute cough Continue OTC medication for symptom relief (Delsym)   General Counseling: Makilah verbalizes understanding of the findings of today's phone visit and agrees with plan of treatment. I have discussed any further diagnostic evaluation that may be needed or ordered today. We also reviewed her medications today. she has been encouraged to call the office with any questions or concerns that should arise related to todays visit.  Return if symptoms worsen or fail to improve.   No orders of the defined types were placed in this encounter.   Meds ordered this encounter  Medications   azithromycin (ZITHROMAX) 250 MG tablet    Sig: Take 2 tablets on day 1, then 1 tablet daily on days 2 through 5    Dispense:  6 tablet    Refill:  0    Time spent:10 Minutes Time spent with patient included reviewing progress notes, labs, imaging studies, and discussing plan for follow up.  [place] Controlled Substance Database was reviewed by me for overdose risk score (ORS) if appropriate.  This patient was seen by Sallyanne Kuster, FNP-C in collaboration  with Dr. Beverely Risen as a part of collaborative care agreement.  Crysten Kaman R. Tedd Sias, MSN, FNP-C Internal medicine

## 2023-03-29 ENCOUNTER — Encounter: Payer: Self-pay | Admitting: Nurse Practitioner

## 2023-03-29 ENCOUNTER — Ambulatory Visit (INDEPENDENT_AMBULATORY_CARE_PROVIDER_SITE_OTHER): Admitting: Nurse Practitioner

## 2023-03-29 VITALS — BP 128/75 | HR 100 | Temp 98.8°F | Resp 16 | Ht 63.0 in | Wt 218.8 lb

## 2023-03-29 DIAGNOSIS — J028 Acute pharyngitis due to other specified organisms: Secondary | ICD-10-CM | POA: Diagnosis not present

## 2023-03-29 DIAGNOSIS — J029 Acute pharyngitis, unspecified: Secondary | ICD-10-CM

## 2023-03-29 DIAGNOSIS — B9689 Other specified bacterial agents as the cause of diseases classified elsewhere: Secondary | ICD-10-CM

## 2023-03-29 LAB — POCT RAPID STREP A (OFFICE): Rapid Strep A Screen: NEGATIVE

## 2023-03-29 MED ORDER — AZITHROMYCIN 500 MG PO TABS: 500.0000 mg | ORAL_TABLET | Freq: Every day | ORAL | 0 refills | Status: AC

## 2023-03-29 NOTE — Progress Notes (Signed)
 [facility] [address] [place], [place] [address]  Internal MEDICINE  Office Visit Note  Patient Name: Miranda Briggs  [id number]  [id number]  Date of Service: 03/29/2023  Chief Complaint  Patient presents with   Acute Visit    Sore throat, L ear hurting     HPI Miranda Briggs presents for an acute sick visit for sore throat.  --onset of symptoms was yesterday.  --reports sore throat, difficulty swallowing, left ear pain, hoarseness.     Current Medication:  Outpatient Encounter Medications as of 03/29/2023  Medication Sig   acetaminophen (TYLENOL) 500 MG tablet Take 1,000 mg by mouth every 6 (six) hours as needed for moderate pain or headache.   azithromycin (ZITHROMAX) 500 MG tablet Take 1 tablet (500 mg total) by mouth daily for 5 days. Take with food   buPROPion (WELLBUTRIN XL) 150 MG 24 hr tablet Take 1 tablet (150 mg total) by mouth daily.   cholestyramine (QUESTRAN) 4 g packet Take 1 packet (4 g total) by mouth 3 (three) times daily with meals.   levothyroxine (SYNTHROID) 50 MCG tablet Take 1 tablet by mouth daily, except for 1.5 tabs on Wed and Sun.   Multiple Vitamin (MULTIVITAMIN WITH MINERALS) TABS tablet Take 1 tablet by mouth every evening.   topiramate (TOPAMAX) 25 MG tablet Take 1 tablet (25 mg total) by mouth 2 (two) times daily.   No facility-administered encounter medications on file as of 03/29/2023.      Medical History: Past Medical History:  Diagnosis Date   GERD (gastroesophageal reflux disease)    Hypertension    Hypothyroidism    Thyroid disease      Vital Signs: BP (!) 140/101   Pulse 100   Temp 98.8 F (37.1 C)   Resp 16   Ht 5\' 3"  (1.6 m)   Wt 218 lb 12.8 oz (99.2 kg)   SpO2 97%   BMI 38.76 kg/m    Review of Systems  Constitutional:  Positive for fatigue.  HENT:  Positive for congestion, ear pain, postnasal drip, sinus pressure, sore throat, trouble swallowing and voice change.   Respiratory:  Negative for cough,  chest tightness, shortness of breath and wheezing.   Cardiovascular: Negative.  Negative for chest pain and palpitations.  Neurological:  Positive for headaches.    Physical Exam Vitals reviewed.  Constitutional:      General: She is not in acute distress.    Appearance: Normal appearance. She is obese. She is ill-appearing.  HENT:     Head: Normocephalic and atraumatic.     Right Ear: Tympanic membrane and external ear normal. Swelling present.     Left Ear: Tympanic membrane and external ear normal. Swelling present.     Nose: Mucosal edema, congestion and rhinorrhea present.     Right Turbinates: Swollen and pale.     Left Turbinates: Swollen and pale.     Right Sinus: Maxillary sinus tenderness present. No frontal sinus tenderness.     Left Sinus: Maxillary sinus tenderness present. No frontal sinus tenderness.     Mouth/Throat:     Mouth: Mucous membranes are moist.     Pharynx: Posterior oropharyngeal erythema present.     Tonsils: 1+ on the right. 1+ on the left.  Eyes:     Extraocular Movements: Extraocular movements intact.     Pupils: Pupils are equal, round, and reactive to light.  Cardiovascular:     Rate and Rhythm: Normal rate and regular rhythm.  Heart sounds: Normal heart sounds. No murmur heard. Pulmonary:     Effort: Pulmonary effort is normal. No respiratory distress.     Breath sounds: Normal breath sounds. No wheezing.  Lymphadenopathy:     Cervical: Cervical adenopathy present.  Skin:    General: Skin is warm and dry.     Capillary Refill: Capillary refill takes less than 2 seconds.  Neurological:     Mental Status: She is alert and oriented to person, place, and time.  Psychiatric:        Mood and Affect: Mood normal.        Behavior: Behavior normal.       Assessment/Plan: 1. Acute bacterial pharyngitis (Primary) 5 days of azithromycin prescribed. Take until gone  - azithromycin (ZITHROMAX) 500 MG tablet; Take 1 tablet (500 mg total) by  mouth daily for 5 days. Take with food  Dispense: 5 tablet; Refill: 0  2. Sore throat Negative for strep - POCT rapid strep A    General Counseling: Miranda Briggs verbalizes understanding of the findings of todays visit and agrees with plan of treatment. I have discussed any further diagnostic evaluation that may be needed or ordered today. We also reviewed her medications today. she has been encouraged to call the office with any questions or concerns that should arise related to todays visit.    Counseling:    Orders Placed This Encounter  Procedures   POCT rapid strep A    Meds ordered this encounter  Medications   azithromycin (ZITHROMAX) 500 MG tablet    Sig: Take 1 tablet (500 mg total) by mouth daily for 5 days. Take with food    Dispense:  5 tablet    Refill:  0    Fill new script today.    Return if symptoms worsen or fail to improve.  [place] Controlled Substance Database was reviewed by me for overdose risk score (ORS)  Time spent:20 Minutes Time spent with patient included reviewing progress notes, labs, imaging studies, and discussing plan for follow up.   This patient was seen by Sallyanne Kuster, FNP-C in collaboration with Dr. Beverely Risen as a part of collaborative care agreement.  Hensley Treat R. Tedd Sias, MSN, FNP-C Internal Medicine

## 2023-05-09 ENCOUNTER — Encounter: Payer: BC Managed Care – PPO | Admitting: Physician Assistant

## 2023-05-20 DIAGNOSIS — E782 Mixed hyperlipidemia: Secondary | ICD-10-CM | POA: Diagnosis not present

## 2023-05-20 DIAGNOSIS — E039 Hypothyroidism, unspecified: Secondary | ICD-10-CM | POA: Diagnosis not present

## 2023-05-20 DIAGNOSIS — R5383 Other fatigue: Secondary | ICD-10-CM | POA: Diagnosis not present

## 2023-05-20 DIAGNOSIS — L299 Pruritus, unspecified: Secondary | ICD-10-CM | POA: Diagnosis not present

## 2023-05-21 LAB — LIPID PANEL WITH LDL/HDL RATIO
Cholesterol, Total: 157 mg/dL (ref 100–199)
HDL: 46 mg/dL (ref >39)
LDL Chol Calc (NIH): 84 mg/dL (ref 0–99)
LDL/HDL Ratio: 1.8 ratio (ref 0.0–3.2)
Triglycerides: 154 mg/dL — ABNORMAL HIGH (ref 0–149)
VLDL Cholesterol Cal: 27 mg/dL (ref 5–40)

## 2023-05-21 LAB — COMPREHENSIVE METABOLIC PANEL WITH GFR
ALT: 34 IU/L — ABNORMAL HIGH (ref 0–32)
AST: 25 IU/L (ref 0–40)
Albumin: 4.3 g/dL (ref 3.9–4.9)
Alkaline Phosphatase: 71 IU/L (ref 44–121)
BUN/Creatinine Ratio: 16 (ref 9–23)
BUN: 12 mg/dL (ref 6–20)
Bilirubin Total: 0.5 mg/dL (ref 0.0–1.2)
CO2: 25 mmol/L (ref 20–29)
Calcium: 9.3 mg/dL (ref 8.7–10.2)
Chloride: 101 mmol/L (ref 96–106)
Creatinine, Ser: 0.77 mg/dL (ref 0.57–1.00)
Globulin, Total: 2.2 g/dL (ref 1.5–4.5)
Glucose: 92 mg/dL (ref 70–99)
Potassium: 4.1 mmol/L (ref 3.5–5.2)
Sodium: 139 mmol/L (ref 134–144)
Total Protein: 6.5 g/dL (ref 6.0–8.5)
eGFR: 104 mL/min/{1.73_m2} (ref >59)

## 2023-05-21 LAB — CBC WITH DIFFERENTIAL/PLATELET
Basophils Absolute: 0.1 10*3/uL (ref 0.0–0.2)
Basos: 1 %
EOS (ABSOLUTE): 0.2 10*3/uL (ref 0.0–0.4)
Eos: 3 %
Hematocrit: 42.4 % (ref 34.0–46.6)
Hemoglobin: 14.4 g/dL (ref 11.1–15.9)
Immature Grans (Abs): 0 10*3/uL (ref 0.0–0.1)
Immature Granulocytes: 0 %
Lymphocytes Absolute: 2.2 10*3/uL (ref 0.7–3.1)
Lymphs: 28 %
MCH: 32.7 pg (ref 26.6–33.0)
MCHC: 34 g/dL (ref 31.5–35.7)
MCV: 96 fL (ref 79–97)
Monocytes Absolute: 0.6 10*3/uL (ref 0.1–0.9)
Monocytes: 8 %
Neutrophils Absolute: 4.8 10*3/uL (ref 1.4–7.0)
Neutrophils: 60 %
Platelets: 318 10*3/uL (ref 150–450)
RBC: 4.4 x10E6/uL (ref 3.77–5.28)
RDW: 12.2 % (ref 11.7–15.4)
WBC: 8 10*3/uL (ref 3.4–10.8)

## 2023-05-21 LAB — HEPATIC FUNCTION PANEL
ALT: 31 IU/L (ref 0–32)
AST: 23 IU/L (ref 0–40)
Albumin: 4.4 g/dL (ref 3.9–4.9)
Alkaline Phosphatase: 72 IU/L (ref 44–121)
Bilirubin Total: 0.6 mg/dL (ref 0.0–1.2)
Bilirubin, Direct: 0.2 mg/dL (ref 0.00–0.40)
Total Protein: 6.5 g/dL (ref 6.0–8.5)

## 2023-05-21 LAB — TSH+FREE T4
Free T4: 1.23 ng/dL (ref 0.82–1.77)
TSH: 2.95 u[IU]/mL (ref 0.450–4.500)

## 2023-05-23 ENCOUNTER — Ambulatory Visit (INDEPENDENT_AMBULATORY_CARE_PROVIDER_SITE_OTHER): Admitting: Physician Assistant

## 2023-05-23 ENCOUNTER — Encounter: Payer: Self-pay | Admitting: Physician Assistant

## 2023-05-23 VITALS — BP 128/72 | HR 83 | Temp 98.3°F | Resp 16 | Ht 63.0 in | Wt 224.4 lb

## 2023-05-23 DIAGNOSIS — Z0001 Encounter for general adult medical examination with abnormal findings: Secondary | ICD-10-CM | POA: Diagnosis not present

## 2023-05-23 DIAGNOSIS — E039 Hypothyroidism, unspecified: Secondary | ICD-10-CM

## 2023-05-23 DIAGNOSIS — E669 Obesity, unspecified: Secondary | ICD-10-CM | POA: Diagnosis not present

## 2023-05-23 DIAGNOSIS — S20161A Insect bite (nonvenomous) of breast, right breast, initial encounter: Secondary | ICD-10-CM

## 2023-05-23 MED ORDER — TOPIRAMATE 25 MG PO TABS: 25.0000 mg | ORAL_TABLET | Freq: Two times a day (BID) | ORAL | 1 refills | Status: DC

## 2023-05-23 NOTE — Progress Notes (Signed)
 [facility] [address] [place], [place] [address]  Internal MEDICINE  Office Visit Note  Patient Name: Miranda Briggs  [id number]  [id number]  Date of Service: 05/23/2023  Chief Complaint  Patient presents with   Annual Exam   Gastroesophageal Reflux   Hypertension   Insect Bite    Right side near ribs, possible spider bite    HPI Pt is here for routine follow up -BP at home 120s/80s, improved on recheck in office -Labs reviewed: ALT borderline, TG a little elevated, other wise looks good -Thinks Pap last done in 2021, but has had vaginal hysterectomy. Discussed pap generally not continued unless any abnormal findings previously and she can confirm with OBGYN that there is no need to continue vaginal paps -found possible insect/spider bite when she woke up on Friday morning, red with purple around it. Has been doing antibacterial ointment. Not itchy or painful. Happened while sleeping. Not there the night before when she showered before bed. Had not been outside. It is along Right breast, red spot remains, but not tender and will monitor  Current Medication: Outpatient Encounter Medications as of 05/23/2023  Medication Sig   acetaminophen  (TYLENOL ) 500 MG tablet Take 1,000 mg by mouth every 6 (six) hours as needed for moderate pain or headache.   buPROPion  (WELLBUTRIN  XL) 150 MG 24 hr tablet Take 1 tablet (150 mg total) by mouth daily.   cholestyramine  (QUESTRAN ) 4 g packet Take 1 packet (4 g total) by mouth 3 (three) times daily with meals.   levothyroxine  (SYNTHROID ) 50 MCG tablet Take 1 tablet by mouth daily, except for 1.5 tabs on Wed and Sun.   Multiple Vitamin (MULTIVITAMIN WITH MINERALS) TABS tablet Take 1 tablet by mouth every evening.   [DISCONTINUED] topiramate  (TOPAMAX ) 25 MG tablet Take 1 tablet (25 mg total) by mouth 2 (two) times daily.   topiramate  (TOPAMAX ) 25 MG tablet Take 1 tablet (25 mg total) by mouth 2 (two) times daily.   No  facility-administered encounter medications on file as of 05/23/2023.    Surgical History: Past Surgical History:  Procedure Laterality Date   BILATERAL SALPINGECTOMY Bilateral 12/11/2018   Procedure: BILATERAL SALPINGECTOMY;  Surgeon: Schermerhorn, Joselyn Nicely, MD;  Location: [facility] ORS;  Service: Gynecology;  Laterality: Bilateral;   CHOLECYSTECTOMY N/A 12/03/2015   Procedure: LAPAROSCOPIC CHOLECYSTECTOMY WITH INTRAOPERATIVE CHOLANGIOGRAM;  Surgeon: Jerlean Mood, MD;  Location: [facility] OR[facility];  Service: General;  Laterality: N/A;   TONSILLECTOMY     VAGINAL HYSTERECTOMY N/A 12/11/2018   Procedure: HYSTERECTOMY VAGINAL;  Surgeon: Carolynn Citrin, MD;  Location: [facility];  Service: Gynecology;  Laterality: N/A;   WISDOM TOOTH EXTRACTION      Medical History: Past Medical History:  Diagnosis Date   GERD (gastroesophageal reflux disease)    Hypertension    Hypothyroidism    Thyroid  disease     Family History: Family History  Problem Relation Age of Onset   Diabetes Father    Hyperthyroidism Father    Hypertension Mother     Social History   Socioeconomic History   Marital status: Married    Spouse name: Not on file   Number of children: Not on file   Years of education: Not on file   Highest education level: Not on file  Occupational History   Not on file  Tobacco Use   Smoking status: Never   Smokeless tobacco: Never  Vaping Use   Vaping status: Never Used  Substance and Sexual Activity   Alcohol  use: Yes    Comment: OCC WINE   Drug use: No   Sexual activity: Not on file  Other Topics Concern   Not on file  Social History Narrative   Not on file   Social Drivers of Health   Financial Resource Strain: Not on file  Food Insecurity: Not on file  Transportation Needs: Not on file  Physical Activity: Not on file  Stress: Not on file  Social Connections: Not on file  Intimate Partner Violence: Not on file      Review of Systems  Constitutional:   Negative for chills, fatigue and unexpected weight change.  HENT:  Negative for congestion, postnasal drip, rhinorrhea, sneezing and sore throat.   Eyes:  Negative for redness.  Respiratory:  Negative for cough, chest tightness and shortness of breath.   Cardiovascular:  Negative for chest pain and palpitations.  Gastrointestinal:  Negative for abdominal pain, constipation, diarrhea, nausea and vomiting.  Genitourinary:  Negative for dysuria and frequency.  Musculoskeletal:  Negative for arthralgias, back pain, joint swelling and neck pain.  Skin:  Positive for color change. Negative for rash.       Possible insect/spider bite on right breast  Neurological: Negative.  Negative for tremors and numbness.  Hematological:  Negative for adenopathy. Does not bruise/bleed easily.  Psychiatric/Behavioral:  Negative for behavioral problems (Depression), sleep disturbance and suicidal ideas. The patient is not nervous/anxious.     Vital Signs: BP 128/72 Comment: 140/75  Pulse 83   Temp 98.3 F (36.8 C)   Resp 16   Ht 5\' 3"  (1.6 m)   Wt 224 lb 6.4 oz (101.8 kg)   SpO2 99%   BMI 39.75 kg/m    Physical Exam Vitals and nursing note reviewed.  Constitutional:      General: She is not in acute distress.    Appearance: Normal appearance. She is obese. She is not ill-appearing.  HENT:     Head: Normocephalic and atraumatic.  Eyes:     Extraocular Movements: Extraocular movements intact.     Pupils: Pupils are equal, round, and reactive to light.  Cardiovascular:     Rate and Rhythm: Normal rate and regular rhythm.  Pulmonary:     Effort: Pulmonary effort is normal. No respiratory distress.  Chest:     Chest wall: No tenderness.  Breasts:    Right: Skin change present. No mass or tenderness.     Left: No mass or tenderness.    Abdominal:     General: Abdomen is flat.     Palpations: Abdomen is soft.     Tenderness: There is no abdominal tenderness.  Musculoskeletal:        General:  Normal range of motion.  Skin:    General: Skin is warm and dry.  Neurological:     Mental Status: She is alert and oriented to person, place, and time.     Cranial Nerves: No cranial nerve deficit.     Coordination: Coordination normal.     Gait: Gait normal.  Psychiatric:        Mood and Affect: Mood normal.        Behavior: Behavior normal.        Assessment/Plan: 1. Encounter for general adult medical examination with abnormal findings (Primary) CPE performed, labs reviewed  2. Insect bite (nonvenomous) of breast, right breast, initial encounter (CODE) Likely insect vs spider bite that occurred while sleeping. Area is non tender and no signs of infection on exam. Continue  to monitor and call if any changes.  3. Acquired hypothyroidism Stable, continue synthroid  as before  4. Obesity (BMI 30-39.9) May restart topamax  while working on diet and exercise - topiramate  (TOPAMAX ) 25 MG tablet; Take 1 tablet (25 mg total) by mouth 2 (two) times daily.  Dispense: 180 tablet; Refill: 1   General Counseling: Caria verbalizes understanding of the findings of todays visit and agrees with plan of treatment. I have discussed any further diagnostic evaluation that may be needed or ordered today. We also reviewed her medications today. she has been encouraged to call the office with any questions or concerns that should arise related to todays visit.    No orders of the defined types were placed in this encounter.   Meds ordered this encounter  Medications   topiramate  (TOPAMAX ) 25 MG tablet    Sig: Take 1 tablet (25 mg total) by mouth 2 (two) times daily.    Dispense:  180 tablet    Refill:  1    This patient was seen by Taylor Favia, PA-C in collaboration with Dr. Verneta Gone as a part of collaborative care agreement.   Total time spent:35 Minutes Time spent includes review of chart, medications, test results, and follow up plan with the patient.      Dr Fozia M  [facility]

## 2023-07-21 ENCOUNTER — Encounter: Payer: Self-pay | Admitting: Dermatology

## 2023-07-21 ENCOUNTER — Ambulatory Visit: Payer: Self-pay | Admitting: Dermatology

## 2023-07-21 DIAGNOSIS — D229 Melanocytic nevi, unspecified: Secondary | ICD-10-CM

## 2023-07-21 DIAGNOSIS — L578 Other skin changes due to chronic exposure to nonionizing radiation: Secondary | ICD-10-CM | POA: Diagnosis not present

## 2023-07-21 DIAGNOSIS — O24419 Gestational diabetes mellitus in pregnancy, unspecified control: Secondary | ICD-10-CM | POA: Insufficient documentation

## 2023-07-21 DIAGNOSIS — Z808 Family history of malignant neoplasm of other organs or systems: Secondary | ICD-10-CM

## 2023-07-21 DIAGNOSIS — L814 Other melanin hyperpigmentation: Secondary | ICD-10-CM

## 2023-07-21 DIAGNOSIS — L299 Pruritus, unspecified: Secondary | ICD-10-CM | POA: Diagnosis not present

## 2023-07-21 DIAGNOSIS — W908XXA Exposure to other nonionizing radiation, initial encounter: Secondary | ICD-10-CM

## 2023-07-21 DIAGNOSIS — Z1283 Encounter for screening for malignant neoplasm of skin: Secondary | ICD-10-CM

## 2023-07-21 DIAGNOSIS — D1801 Hemangioma of skin and subcutaneous tissue: Secondary | ICD-10-CM

## 2023-07-21 NOTE — Progress Notes (Signed)
   New Patient Visit   Subjective  Miranda Briggs is a 33 y.o. female who presents for the following: Skin Cancer Screening and Full Body Skin Exam no hx of skin cancer, mother with hx of Melanoma x 2, Pruritus all over, most days, worse after shower, takes luke warm showers, using Dove sensitive skin, and Aveeno moisturizer  Patient accompanied by daughter.  The patient presents for Total-Body Skin Exam (TBSE) for skin cancer screening and mole check. The patient has spots, moles and lesions to be evaluated, some may be new or changing and the patient may have concern these could be cancer.    The following portions of the chart were reviewed this encounter and updated as appropriate: medications, allergies, medical history  Review of Systems:  No other skin or systemic complaints except as noted in HPI or Assessment and Plan.  Objective  Well appearing patient in no apparent distress; mood and affect are within normal limits.  A full examination was performed including scalp, head, eyes, ears, nose, lips, neck, chest, axillae, abdomen, back, buttocks, bilateral upper extremities, bilateral lower extremities, hands, feet, fingers, toes, fingernails, and toenails. All findings within normal limits unless otherwise noted below.   Exam of nails limited by presence of nail polish.   Relevant physical exam findings are noted in the Assessment and Plan.    Assessment & Plan   SKIN CANCER SCREENING PERFORMED TODAY.  ACTINIC DAMAGE - Chronic condition, secondary to cumulative UV/sun exposure - diffuse scaly erythematous macules with underlying dyspigmentation - Recommend daily broad spectrum sunscreen SPF 30+ to sun-exposed areas, reapply every 2 hours as needed.  - Staying in the shade or wearing long sleeves, sun glasses (UVA+UVB protection) and wide brim hats (4-inch brim around the entire circumference of the hat) are also recommended for sun protection.  - Call for new or changing  lesions.  LENTIGINES, HEMANGIOMAS - Benign normal skin lesions - Benign-appearing - Call for any changes  MELANOCYTIC NEVI - Tan-brown and/or pink-flesh-colored symmetric macules and papules - Benign appearing on exam today - Observation - Call clinic for new or changing moles - Recommend daily use of broad spectrum spf 30+ sunscreen to sun-exposed areas.   DIFFUSE PRURITUS Exam: no rashes on body  Labs: 05/20/23 CBC diff normal, CMP normal except slightly high ALT 34, TSH FT4 normal  Treatment:  No rash that would explain pruritus. Labs are reassuring. Cont mild soap and moisturizer qd Recommend Zyrtec 10mg  1 po bid, if itching not improving can increase up to 2 po bid. 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  I, Grayce Saunas, RMA, am acting as scribe for Boneta Sharps, MD .   Documentation: I have reviewed the above documentation for accuracy and completeness, and I agree with the above.  Boneta Sharps, MD

## 2023-07-21 NOTE — Patient Instructions (Addendum)
 Melanoma ABCDEs  Melanoma is the most dangerous type of skin cancer, and is the leading cause of death from skin disease.  You are more likely to develop melanoma if you: Have light-colored skin, light-colored eyes, or red or blond hair Spend a lot of time in the sun Tan regularly, either outdoors or in a tanning bed Have had blistering sunburns, especially during childhood Have a close family member who has had a melanoma Have atypical moles or large birthmarks  Early detection of melanoma is key since treatment is typically straightforward and cure rates are extremely high if we catch it early.   The first sign of melanoma is often a change in a mole or a new dark spot.  The ABCDE system is a way of remembering the signs of melanoma.  A for asymmetry:  The two halves do not match. B for border:  The edges of the growth are irregular. C for color:  A mixture of colors are present instead of an even brown color. D for diameter:  Melanomas are usually (but not always) greater than 6mm - the size of a pencil eraser. E for evolution:  The spot keeps changing in size, shape, and color.  Please check your skin once per month between visits. You can use a small mirror in front and a large mirror behind you to keep an eye on the back side or your body.   If you see any new or changing lesions before your next follow-up, please call to schedule a visit.  Please continue daily skin protection including broad spectrum sunscreen SPF 30+ to sun-exposed areas, reapplying every 2 hours as needed when you're outdoors.   Staying in the shade or wearing long sleeves, sun glasses (UVA+UVB protection) and wide brim hats (4-inch brim around the entire circumference of the hat) are also recommended for sun protection.    For itching start Zyrtec 10mg  1 pill twice a day, if not improving may increase up to 2 pills twice a day, try this for 2 months, if still no improvement you can send a mychart message and we  can send referral to Allergist    Due to recent changes in healthcare laws, you may see results of your pathology and/or laboratory studies on MyChart before the doctors have had a chance to review them. We understand that in some cases there may be results that are confusing or concerning to you. Please understand that not all results are received at the same time and often the doctors may need to interpret multiple results in order to provide you with the best plan of care or course of treatment. Therefore, we ask that you please give us  2 business days to thoroughly review all your results before contacting the office for clarification. Should we see a critical lab result, you will be contacted sooner.   If You Need Anything After Your Visit  If you have any questions or concerns for your doctor, please call our main line at [phone] and press option 4 to reach your doctor's medical assistant. If no one answers, please leave a voicemail as directed and we will return your call as soon as possible. Messages left after 4 pm will be answered the following business day.   You may also send us  a message via MyChart. We typically respond to MyChart messages within 1-2 business days.  For prescription refills, please ask your pharmacy to contact our office. Our fax number is [phone].  If you have  an urgent issue when the clinic is closed that cannot wait until the next business day, you can page your doctor at the number below.    Please note that while we do our best to be available for urgent issues outside of office hours, we are not available 24/7.   If you have an urgent issue and are unable to reach us , you may choose to seek medical care at your doctor's office, retail clinic, urgent care center, or emergency room.  If you have a medical emergency, please immediately call 911 or go to the emergency department.  Pager Numbers  - Dr. Hester: [phone]  - Dr. Jackquline:  [phone]  - Dr. Claudene: [phone]   In the event of inclement weather, please call our main line at [phone] for an update on the status of any delays or closures.  Dermatology Medication Tips: Please keep the boxes that topical medications come in in order to help keep track of the instructions about where and how to use these. Pharmacies typically print the medication instructions only on the boxes and not directly on the medication tubes.   If your medication is too expensive, please contact our office at [phone] option 4 or send us  a message through MyChart.   We are unable to tell what your co-pay for medications will be in advance as this is different depending on your insurance coverage. However, we may be able to find a substitute medication at lower cost or fill out paperwork to get insurance to cover a needed medication.   If a prior authorization is required to get your medication covered by your insurance company, please allow us  1-2 business days to complete this process.  Drug prices often vary depending on where the prescription is filled and some pharmacies may offer cheaper prices.  The website www.goodrx.com contains coupons for medications through different pharmacies. The prices here do not account for what the cost may be with help from insurance (it may be cheaper with your insurance), but the website can give you the price if you did not use any insurance.  - You can print the associated coupon and take it with your prescription to the pharmacy.  - You may also stop by our office during regular business hours and pick up a GoodRx coupon card.  - If you need your prescription sent electronically to a different pharmacy, notify our office through [facility] or by phone at [phone] option 4.     Si Usted Necesita Algo Despus de Su Visita  Tambin puede enviarnos un mensaje a travs de Clinical cytogeneticist. Por lo general respondemos a los mensajes de  MyChart en el transcurso de 1 a 2 das hbiles.  Para renovar recetas, por favor pida a su farmacia que se ponga en contacto con nuestra oficina. Randi lakes de fax es [place] [phone].  Si tiene un asunto urgente cuando la clnica est cerrada y que no puede esperar hasta el siguiente da hbil, puede llamar/localizar a su doctor(a) al nmero que aparece a continuacin.   Por favor, tenga en cuenta que aunque hacemos todo lo posible para estar disponibles para asuntos urgentes fuera del horario de [place], no estamos disponibles las 24 horas del da, los 7 [address]  [address] de la [place].   Si tiene un problema urgente y no puede comunicarse con nosotros, puede optar por buscar atencin mdica  en el consultorio de su doctor(a), en una clnica privada, en un centro de atencin urgente o en una sala  de emergencias.  Si tiene Engineer, drilling, por favor llame inmediatamente al 911 o vaya a la sala de emergencias.  Nmeros de bper  - Dr. Hester: [phone]  - Dra. Jackquline: [phone]  - Dr. Claudene: [phone]   En caso de inclemencias del tiempo, por favor llame a landry capes principal al [phone] para una actualizacin sobre el [place] de cualquier retraso o cierre.  Consejos para la medicacin en dermatologa: Por favor, guarde las cajas en las que vienen los medicamentos de uso tpico para ayudarle a seguir las instrucciones sobre dnde y cmo usarlos. Las farmacias generalmente imprimen las instrucciones del medicamento slo en las cajas y no directamente en los tubos del [place].   Si su medicamento es muy caro, por favor, pngase en contacto con landry rieger llamando al [phone] y presione la opcin 4 o envenos un mensaje a travs de Clinical cytogeneticist.   No podemos decirle cul ser su copago por los medicamentos por adelantado ya que esto es diferente dependiendo de la cobertura de su seguro. Sin embargo, es posible que podamos encontrar un medicamento sustituto a Advice worker un formulario para que el seguro cubra el medicamento que se considera necesario.   Si se requiere una autorizacin previa para que su compaa de seguros malta su medicamento, por favor permtanos de 1 a 2 das hbiles para completar este proceso.  Los precios de los medicamentos varan con frecuencia dependiendo del Environmental consultant de dnde se surte la receta y alguna farmacias pueden ofrecer precios ms baratos.  El sitio web www.goodrx.com tiene cupones para medicamentos de Health and safety inspector. Los precios aqu no tienen en cuenta lo que podra costar con la ayuda del seguro (puede ser ms barato con su seguro), pero el sitio web puede darle el precio si no utiliz Tourist information centre manager.  - Puede imprimir el cupn correspondiente y llevarlo con su receta a la farmacia.  - Tambin puede pasar por nuestra oficina durante el horario de atencin regular y Education officer, museum una tarjeta de cupones de GoodRx.  - Si necesita que su receta se enve electrnicamente a una farmacia diferente, informe a nuestra oficina a travs de MyChart de [place] o por telfono llamando al [phone] y presione la opcin 4.

## 2023-09-06 ENCOUNTER — Ambulatory Visit (INDEPENDENT_AMBULATORY_CARE_PROVIDER_SITE_OTHER): Admitting: Nurse Practitioner

## 2023-09-06 ENCOUNTER — Encounter: Payer: Self-pay | Admitting: Nurse Practitioner

## 2023-09-06 VITALS — BP 134/82 | HR 99 | Temp 98.2°F | Resp 16 | Ht 63.0 in | Wt 223.2 lb

## 2023-09-06 DIAGNOSIS — J028 Acute pharyngitis due to other specified organisms: Secondary | ICD-10-CM

## 2023-09-06 DIAGNOSIS — B9689 Other specified bacterial agents as the cause of diseases classified elsewhere: Secondary | ICD-10-CM

## 2023-09-06 MED ORDER — CLINDAMYCIN HCL 300 MG PO CAPS
300.0000 mg | ORAL_CAPSULE | Freq: Three times a day (TID) | ORAL | 0 refills | Status: AC
Start: 2023-09-06 — End: 2023-09-16

## 2023-09-06 NOTE — Progress Notes (Signed)
 [facility] [address] [place], [place] [address]  Internal MEDICINE  Office Visit Note  Patient Name: Miranda Briggs  [id number]  [id number]  Date of Service: 09/06/2023  Chief Complaint  Patient presents with   Acute Visit    Sore throat- Saturday night, nothing over the counter has helped. Covid test negative.      HPI Miranda Briggs presents for an acute sick visit for sore throat.  --onset of symptosm was about 4 days ago --reports sore throat --negative for covid --denies any cough, sinus pressure, SOB, wheezing, or chest tightness.  --reports being around someone recently who had strep throat.      Current Medication:  Outpatient Encounter Medications as of 09/06/2023  Medication Sig   clindamycin  (CLEOCIN ) 300 MG capsule Take 1 capsule (300 mg total) by mouth 3 (three) times daily for 10 days. Take with food   acetaminophen  (TYLENOL ) 500 MG tablet Take 1,000 mg by mouth every 6 (six) hours as needed for moderate pain or headache.   buPROPion  (WELLBUTRIN  XL) 150 MG 24 hr tablet Take 1 tablet (150 mg total) by mouth daily. (Patient not taking: Reported on 07/21/2023)   cholestyramine  (QUESTRAN ) 4 g packet Take 1 packet (4 g total) by mouth 3 (three) times daily with meals. (Patient not taking: Reported on 07/21/2023)   levothyroxine  (SYNTHROID ) 50 MCG tablet Take 1 tablet by mouth daily, except for 1.5 tabs on Wed and Sun.   Multiple Vitamin (MULTIVITAMIN WITH MINERALS) TABS tablet Take 1 tablet by mouth every evening.   topiramate  (TOPAMAX ) 25 MG tablet Take 1 tablet (25 mg total) by mouth 2 (two) times daily.   No facility-administered encounter medications on file as of 09/06/2023.      Medical History: Past Medical History:  Diagnosis Date   GERD (gastroesophageal reflux disease)    Hypertension    Hypothyroidism    Thyroid  disease      Vital Signs: BP 134/82   Pulse 99   Temp 98.2 F (36.8 C)   Resp 16   Ht 5' 3 (1.6 m)   Wt 223 lb 3.2 oz  (101.2 kg)   SpO2 97%   BMI 39.54 kg/m    Review of Systems  Constitutional:  Negative for fatigue.  HENT:  Positive for sore throat and trouble swallowing. Negative for congestion, postnasal drip, rhinorrhea, sinus pressure and sinus pain.   Respiratory:  Negative for cough, chest tightness, shortness of breath and wheezing.   Cardiovascular: Negative.  Negative for chest pain and palpitations.  Gastrointestinal: Negative.   Musculoskeletal: Negative.   Neurological: Negative.  Negative for headaches.    Physical Exam Vitals reviewed.  Constitutional:      General: She is not in acute distress.    Appearance: Normal appearance. She is not ill-appearing.  HENT:     Head: Normocephalic and atraumatic.     Nose: Mucosal edema, congestion and rhinorrhea present.     Right Turbinates: Swollen.     Left Turbinates: Swollen.     Right Sinus: No maxillary sinus tenderness or frontal sinus tenderness.     Left Sinus: No maxillary sinus tenderness or frontal sinus tenderness.     Mouth/Throat:     Mouth: Mucous membranes are moist.     Pharynx: Posterior oropharyngeal erythema present.  Eyes:     Pupils: Pupils are equal, round, and reactive to light.  Cardiovascular:     Rate and Rhythm: Normal rate and regular rhythm.  Pulmonary:  Effort: Pulmonary effort is normal. No respiratory distress.  Neurological:     Mental Status: She is alert and oriented to person, place, and time.  Psychiatric:        Mood and Affect: Mood normal.        Behavior: Behavior normal.       Assessment/Plan: 1. Acute bacterial pharyngitis (Primary) Take clindamycin  as prescribed until gone.  - clindamycin  (CLEOCIN ) 300 MG capsule; Take 1 capsule (300 mg total) by mouth 3 (three) times daily for 10 days. Take with food  Dispense: 30 capsule; Refill: 0   General Counseling: [name] verbalizes understanding of the findings of todays visit and agrees with plan of treatment. I have discussed any  further diagnostic evaluation that may be needed or ordered today. We also reviewed her medications today. she has been encouraged to call the office with any questions or concerns that should arise related to todays visit.    Counseling:    No orders of the defined types were placed in this encounter.   Meds ordered this encounter  Medications   clindamycin  (CLEOCIN ) 300 MG capsule    Sig: Take 1 capsule (300 mg total) by mouth 3 (three) times daily for 10 days. Take with food    Dispense:  30 capsule    Refill:  0    Fill new script today    Return if symptoms worsen or fail to improve.  [place] Controlled Substance Database was reviewed by me for overdose risk score (ORS)  Time spent:20 Minutes Time spent with patient included reviewing progress notes, labs, imaging studies, and discussing plan for follow up.   This patient was seen by Mardy Maxin, FNP-C in collaboration with Dr. Sigrid Bathe as a part of collaborative care agreement.  [name] R. Maxin, MSN, FNP-C Internal Medicine

## 2023-09-13 ENCOUNTER — Other Ambulatory Visit: Payer: Self-pay | Admitting: Physician Assistant

## 2023-09-13 DIAGNOSIS — E039 Hypothyroidism, unspecified: Secondary | ICD-10-CM

## 2023-11-21 ENCOUNTER — Ambulatory Visit (INDEPENDENT_AMBULATORY_CARE_PROVIDER_SITE_OTHER): Admitting: Physician Assistant

## 2023-11-21 ENCOUNTER — Encounter: Payer: Self-pay | Admitting: Physician Assistant

## 2023-11-21 VITALS — BP 110/80 | HR 85 | Temp 98.0°F | Resp 16 | Ht 63.0 in | Wt 225.0 lb

## 2023-11-21 DIAGNOSIS — R5383 Other fatigue: Secondary | ICD-10-CM

## 2023-11-21 DIAGNOSIS — E669 Obesity, unspecified: Secondary | ICD-10-CM | POA: Diagnosis not present

## 2023-11-21 DIAGNOSIS — E782 Mixed hyperlipidemia: Secondary | ICD-10-CM

## 2023-11-21 DIAGNOSIS — E039 Hypothyroidism, unspecified: Secondary | ICD-10-CM | POA: Diagnosis not present

## 2023-11-21 MED ORDER — TOPIRAMATE 25 MG PO TABS: 25.0000 mg | ORAL_TABLET | Freq: Two times a day (BID) | ORAL | 1 refills | Status: AC

## 2023-11-21 NOTE — Progress Notes (Signed)
 [facility] [address] [place], [place] [address]  Internal MEDICINE  Office Visit Note  Patient Name: Miranda Briggs  [id number]  [id number]  Date of Service: 11/21/2023  Chief Complaint  Patient presents with   Follow-up   Hypertension   Gastroesophageal Reflux    HPI The patient presents for a routine follow-up regarding thyroid  management and blood pressure monitoring  Thyroid  hormone replacement therapy - Takes Synthroid  50 mcg with a dosing schedule of one and a half tablets on Wednesdays and Sundays, and one tablet on other days - Thyroid  medication dose has been stable for a long time  Blood pressure management - Blood pressure remains stable without antihypertensive medication  Sleep disturbance - Experiences occasional insomnia - Sleep disruption is influenced by her children's sleep patterns  Physical activity limitation - Attempts to exercise but struggles to maintain a regular routine due to childcare responsibilities  Health maintenance - no longer has pap since hysterectomy  Current Medication: Outpatient Encounter Medications as of 11/21/2023  Medication Sig   acetaminophen  (TYLENOL ) 500 MG tablet Take 1,000 mg by mouth every 6 (six) hours as needed for moderate pain or headache.   levothyroxine  (SYNTHROID ) 50 MCG tablet TAKE 1 & 1/2 TABLETS EACH WEDNESDAY & SUNDAY, THEN TAKE 1 TABLET DAILY THE REMAINING 5 DAYS OF WEEK   Multiple Vitamin (MULTIVITAMIN WITH MINERALS) TABS tablet Take 1 tablet by mouth every evening.   [DISCONTINUED] buPROPion  (WELLBUTRIN  XL) 150 MG 24 hr tablet Take 1 tablet (150 mg total) by mouth daily.   [DISCONTINUED] cholestyramine  (QUESTRAN ) 4 g packet Take 1 packet (4 g total) by mouth 3 (three) times daily with meals.   [DISCONTINUED] topiramate  (TOPAMAX ) 25 MG tablet Take 1 tablet (25 mg total) by mouth 2 (two) times daily.   topiramate  (TOPAMAX ) 25 MG tablet Take 1 tablet (25 mg total) by mouth 2 (two) times  daily.   No facility-administered encounter medications on file as of 11/21/2023.    Surgical History: Past Surgical History:  Procedure Laterality Date   BILATERAL SALPINGECTOMY Bilateral 12/11/2018   Procedure: BILATERAL SALPINGECTOMY;  Surgeon: Schermerhorn, Debby PARAS, MD;  Location: [facility] OR[facility];  Service: Gynecology;  Laterality: Bilateral;   CHOLECYSTECTOMY N/A 12/03/2015   Procedure: LAPAROSCOPIC CHOLECYSTECTOMY WITH INTRAOPERATIVE CHOLANGIOGRAM;  Surgeon: Louanne KANDICE Muse, MD;  Location: [facility] ORS;  Service: General;  Laterality: N/A;   TONSILLECTOMY     VAGINAL HYSTERECTOMY N/A 12/11/2018   Procedure: HYSTERECTOMY VAGINAL;  Surgeon: Lovetta Debby PARAS, MD;  Location: [facility] OR[facility];  Service: Gynecology;  Laterality: N/A;   WISDOM TOOTH EXTRACTION      Medical History: Past Medical History:  Diagnosis Date   GERD (gastroesophageal reflux disease)    Hypertension    Hypothyroidism    Thyroid  disease     Family History: Family History  Problem Relation Age of Onset   Melanoma Mother    Hypertension Mother    Diabetes Father    Hyperthyroidism Father     Social History   Socioeconomic History   Marital status: Married    Spouse name: Not on file   Number of children: Not on file   Years of education: Not on file   Highest education level: Not on file  Occupational History   Not on file  Tobacco Use   Smoking status: Never   Smokeless tobacco: Never  Vaping Use   Vaping status: Never Used  Substance and Sexual Activity   Alcohol use: Yes    Comment: OCC WINE  Drug use: No   Sexual activity: Not on file  Other Topics Concern   Not on file  Social History Narrative   Not on file   Social Drivers of Health   Financial Resource Strain: Not on file  Food Insecurity: Not on file  Transportation Needs: Not on file  Physical Activity: Not on file  Stress: Not on file  Social Connections: Not on file  Intimate Partner Violence: Not on file      Review  of Systems  Constitutional:  Negative for chills, fatigue and unexpected weight change.  HENT:  Negative for congestion, postnasal drip, rhinorrhea, sneezing and sore throat.   Eyes:  Negative for redness.  Respiratory:  Negative for cough, chest tightness and shortness of breath.   Cardiovascular:  Negative for chest pain and palpitations.  Gastrointestinal:  Negative for abdominal pain, constipation, diarrhea, nausea and vomiting.  Genitourinary:  Negative for dysuria and frequency.  Musculoskeletal:  Negative for arthralgias, back pain, joint swelling and neck pain.  Skin:  Negative for rash.  Neurological: Negative.  Negative for tremors and numbness.  Hematological:  Negative for adenopathy. Does not bruise/bleed easily.  Psychiatric/Behavioral:  Negative for behavioral problems (Depression), sleep disturbance and suicidal ideas. The patient is not nervous/anxious.     Vital Signs: BP 110/80   Pulse 85   Temp 98 F (36.7 C)   Resp 16   Ht 5' 3 (1.6 m)   Wt 225 lb (102.1 kg)   SpO2 98%   BMI 39.86 kg/m    Physical Exam Vitals and nursing note reviewed.  Constitutional:      Appearance: Normal appearance.  HENT:     Head: Normocephalic and atraumatic.     Mouth/Throat:     Mouth: Mucous membranes are moist.     Pharynx: Oropharynx is clear.  Eyes:     Pupils: Pupils are equal, round, and reactive to light.  Cardiovascular:     Rate and Rhythm: Normal rate and regular rhythm.     Heart sounds: No murmur heard. Neurological:     Mental Status: She is alert and oriented to person, place, and time.  Psychiatric:        Mood and Affect: Mood normal.        Behavior: Behavior normal.        Assessment/Plan: 1. Acquired hypothyroidism (Primary) Stable, will continue to monitor. NO longer seeing endocrinology - TSH + free T4  2. Mixed hyperlipidemia - Lipid Panel With LDL/HDL Ratio  3. Other fatigue - CBC w/Diff/Platelet - Comprehensive metabolic panel with  GFR - TSH + free T4 - Lipid Panel With LDL/HDL Ratio  4. Obesity (BMI 30-39.9) Continue to work on trying to increase exercise - topiramate  (TOPAMAX ) 25 MG tablet; Take 1 tablet (25 mg total) by mouth 2 (two) times daily.  Dispense: 180 tablet; Refill: 1   Discussed the use of AI scribe software for clinical note transcription with the patient, who gave verbal consent to proceed.    General Counseling: Miranda Briggs verbalizes understanding of the findings of todays visit and agrees with plan of treatment. I have discussed any further diagnostic evaluation that may be needed or ordered today. We also reviewed her medications today. she has been encouraged to call the office with any questions or concerns that should arise related to todays visit.    Orders Placed This Encounter  Procedures   CBC w/Diff/Platelet   Comprehensive metabolic panel with GFR   TSH + free T4  Lipid Panel With LDL/HDL Ratio    Meds ordered this encounter  Medications   topiramate  (TOPAMAX ) 25 MG tablet    Sig: Take 1 tablet (25 mg total) by mouth 2 (two) times daily.    Dispense:  180 tablet    Refill:  1    This patient was seen by Tinnie Pro, PA-C in collaboration with Dr. Sigrid Bathe as a part of collaborative care agreement.   Total time spent:30 Minutes Time spent includes review of chart, medications, test results, and follow up plan with the patient.      Dr Fozia M Khan Internal medicine

## 2023-12-20 ENCOUNTER — Ambulatory Visit: Admitting: Internal Medicine

## 2023-12-20 ENCOUNTER — Encounter: Payer: Self-pay | Admitting: Internal Medicine

## 2023-12-20 ENCOUNTER — Telehealth: Payer: Self-pay | Admitting: Physician Assistant

## 2023-12-20 VITALS — BP 112/78 | HR 95 | Temp 98.0°F | Resp 16 | Ht 63.0 in | Wt 224.0 lb

## 2023-12-20 DIAGNOSIS — E040 Nontoxic diffuse goiter: Secondary | ICD-10-CM | POA: Diagnosis not present

## 2023-12-20 DIAGNOSIS — J029 Acute pharyngitis, unspecified: Secondary | ICD-10-CM

## 2023-12-20 LAB — POCT RAPID STREP A (OFFICE): Rapid Strep A Screen: NEGATIVE

## 2023-12-20 MED ORDER — AZITHROMYCIN 250 MG PO TABS: ORAL_TABLET | ORAL | 0 refills | Status: AC

## 2023-12-20 NOTE — Progress Notes (Unsigned)
 [facility] [address] [place], [place] [address]  Internal MEDICINE  Office Visit Note  Patient Name: Miranda Briggs  [id number]  [id number]  Date of Service: 12/21/2023  Chief Complaint  Patient presents with   Acute Visit   Sore Throat    Began yesterday morning, denies any other symptoms. Strep test negative today in office.    HPI Pt is having sore throat since yesterday, no fever and chills  Has not checked for COVID  No one is sick at home, denies any contact with mono  Will be interested in weight loss treatment     Current Medication: Outpatient Encounter Medications as of 12/20/2023  Medication Sig   acetaminophen  (TYLENOL ) 500 MG tablet Take 1,000 mg by mouth every 6 (six) hours as needed for moderate pain or headache.   azithromycin  (ZITHROMAX ) 250 MG tablet Take 2 tablets on day 1, then 1 tablet daily on days 2 through 5   levothyroxine  (SYNTHROID ) 50 MCG tablet TAKE 1 & 1/2 TABLETS EACH WEDNESDAY & SUNDAY, THEN TAKE 1 TABLET DAILY THE REMAINING 5 DAYS OF WEEK   Multiple Vitamin (MULTIVITAMIN WITH MINERALS) TABS tablet Take 1 tablet by mouth every evening.   topiramate  (TOPAMAX ) 25 MG tablet Take 1 tablet (25 mg total) by mouth 2 (two) times daily.   No facility-administered encounter medications on file as of 12/20/2023.    Surgical History: Past Surgical History:  Procedure Laterality Date   BILATERAL SALPINGECTOMY Bilateral 12/11/2018   Procedure: BILATERAL SALPINGECTOMY;  Surgeon: Schermerhorn, Debby PARAS, MD;  Location: [facility];  Service: Gynecology;  Laterality: Bilateral;   CHOLECYSTECTOMY N/A 12/03/2015   Procedure: LAPAROSCOPIC CHOLECYSTECTOMY WITH INTRAOPERATIVE CHOLANGIOGRAM;  Surgeon: Louanne KANDICE Muse, MD;  Location: [facility];  Service: General;  Laterality: N/A;   TONSILLECTOMY     VAGINAL HYSTERECTOMY N/A 12/11/2018   Procedure: HYSTERECTOMY VAGINAL;  Surgeon: Lovetta Debby PARAS, MD;  Location: [facility];  Service:  Gynecology;  Laterality: N/A;   WISDOM TOOTH EXTRACTION      Medical History: Past Medical History:  Diagnosis Date   GERD (gastroesophageal reflux disease)    Hypertension    Hypothyroidism    Thyroid  disease     Family History: Family History  Problem Relation Age of Onset   Melanoma Mother    Hypertension Mother    Diabetes Father    Hyperthyroidism Father     Social History   Socioeconomic History   Marital status: Married    Spouse name: Not on file   Number of children: Not on file   Years of education: Not on file   Highest education level: Not on file  Occupational History   Not on file  Tobacco Use   Smoking status: Never   Smokeless tobacco: Never  Vaping Use   Vaping status: Never Used  Substance and Sexual Activity   Alcohol use: Yes    Comment: OCC WINE   Drug use: No   Sexual activity: Not on file  Other Topics Concern   Not on file  Social History Narrative   Not on file   Social Drivers of Health   Tobacco Use: Low Risk (12/20/2023)   Patient History    Smoking Tobacco Use: Never    Smokeless Tobacco Use: Never    Passive Exposure: Not on file  Financial Resource Strain: Not on file  Food Insecurity: Not on file  Transportation Needs: Not on file  Physical Activity: Not on file  Stress: Not on file  Social Connections: Not on file  Intimate Partner Violence: Not on file  Depression [phone]): Low Risk (11/21/2023)   Depression (PHQ2-9)    PHQ-2 Score: 0  Alcohol Screen: Low Risk (11/21/2023)   Alcohol Screen    Last Alcohol Screening Score (AUDIT): 2  Housing: Not on file  Utilities: Not on file  Health Literacy: Not on file      Review of Systems  Constitutional:  Negative for fatigue.  HENT:  Positive for sore throat. Negative for congestion, mouth sores and postnasal drip.   Respiratory:  Negative for cough.   Cardiovascular:  Negative for chest pain.  Genitourinary:  Negative for flank pain.  Psychiatric/Behavioral:  Negative.      Vital Signs: BP 112/78   Pulse 95   Temp 98 F (36.7 C)   Resp 16   Ht 5' 3 (1.6 m)   Wt 224 lb (101.6 kg)   SpO2 95%   BMI 39.68 kg/m    Physical Exam Constitutional:      Appearance: Normal appearance.  HENT:     Head: Normocephalic and atraumatic.     Nose: Nose normal.     Mouth/Throat:     Mouth: Mucous membranes are moist.     Pharynx: No posterior oropharyngeal erythema.  Eyes:     Extraocular Movements: Extraocular movements intact.     Pupils: Pupils are equal, round, and reactive to light.  Neck:     Thyroid : Thyromegaly present.  Cardiovascular:     Pulses: Normal pulses.     Heart sounds: Normal heart sounds.  Pulmonary:     Effort: Pulmonary effort is normal.     Breath sounds: Normal breath sounds.  Neurological:     General: No focal deficit present.     Mental Status: She is alert.  Psychiatric:        Mood and Affect: Mood normal.        Behavior: Behavior normal.        Assessment/Plan: 1. Sore throat (Primary) Symptomatic, comfort care advised for the next 24 hours, if no improvement seen, will take ABX  - POCT rapid strep A  2. Goiter diffuse Diffusely enlarged goiter on exam  - US  THYROID ; Future   General Counseling: Bellamy verbalizes understanding of the findings of todays visit and agrees with plan of treatment. I have discussed any further diagnostic evaluation that may be needed or ordered today. We also reviewed her medications today. she has been encouraged to call the office with any questions or concerns that should arise related to todays visit.    Orders Placed This Encounter  Procedures   US  THYROID    POCT rapid strep A    Meds ordered this encounter  Medications   azithromycin  (ZITHROMAX ) 250 MG tablet    Sig: Take 2 tablets on day 1, then 1 tablet daily on days 2 through 5    Dispense:  6 tablet    Refill:  0    Total time spent:20 Minutes Time spent includes review of chart, medications,  test results, and follow up plan with the patient.   [place] Controlled Substance Database was reviewed by me.   Dr Rada Zegers M Yidel Teuscher Internal medicine

## 2023-12-20 NOTE — Telephone Encounter (Signed)
 Lvm notifying patient of U/S appointment date, arrival time, location-[place]

## 2023-12-27 ENCOUNTER — Ambulatory Visit

## 2024-01-02 ENCOUNTER — Ambulatory Visit
Admission: RE | Admit: 2024-01-02 | Discharge: 2024-01-02 | Disposition: A | Discharge status: Home / Self Care | Source: Ambulatory Visit | Attending: Internal Medicine | Admitting: Internal Medicine

## 2024-01-02 DIAGNOSIS — E040 Nontoxic diffuse goiter: Secondary | ICD-10-CM | POA: Diagnosis present

## 2024-01-30 ENCOUNTER — Ambulatory Visit: Admitting: Physician Assistant

## 2024-02-02 ENCOUNTER — Encounter: Payer: Self-pay | Admitting: Physician Assistant

## 2024-02-02 ENCOUNTER — Ambulatory Visit (INDEPENDENT_AMBULATORY_CARE_PROVIDER_SITE_OTHER): Admitting: Physician Assistant

## 2024-02-02 VITALS — BP 110/65 | HR 83 | Temp 98.0°F | Resp 16 | Ht 63.0 in | Wt 213.0 lb

## 2024-02-02 DIAGNOSIS — E039 Hypothyroidism, unspecified: Secondary | ICD-10-CM | POA: Diagnosis not present

## 2024-02-02 DIAGNOSIS — R599 Enlarged lymph nodes, unspecified: Secondary | ICD-10-CM

## 2024-02-02 NOTE — Progress Notes (Signed)
 [facility] [address] [place], [place] [address]  Internal MEDICINE  Office Visit Note  Patient Name: Miranda Briggs  [id number]  [id number]  Date of Service: 02/02/2024  Chief Complaint  Patient presents with   Follow-up    Review Thyroid  U/S   Gastroesophageal Reflux   Hypertension    HPI Pt is here for routine follow up to review thyroid  US  -seen in office in Dec for sore throat and found to have thyromegaly/some swelling along neck for US  ordered -Taking 1.5tabs synthroid  daily instead of extra 1/2 only 2 days per week Thyroid  US  IMPRESSION:  1. No sonographic abnormality of the thyroid .  2. Mixed echogenicity structure seen inferior to the LEFT thyroid   lobe measuring 1.7 x 0.9 x 1.2 cm, is favored to be a mildly  prominent lymph nodes. Follow-up ultrasound recommended in 3 months  to document stability.   -pt is feeling well now and has no concerns today. She does have labs already ordered to recheck thyroid  function before CPE  Current Medication: Outpatient Encounter Medications as of 02/02/2024  Medication Sig   acetaminophen  (TYLENOL ) 500 MG tablet Take 1,000 mg by mouth every 6 (six) hours as needed for moderate pain or headache.   levothyroxine  (SYNTHROID ) 50 MCG tablet TAKE 1 & 1/2 TABLETS EACH WEDNESDAY & SUNDAY, THEN TAKE 1 TABLET DAILY THE REMAINING 5 DAYS OF WEEK   Multiple Vitamin (MULTIVITAMIN WITH MINERALS) TABS tablet Take 1 tablet by mouth every evening.   topiramate  (TOPAMAX ) 25 MG tablet Take 1 tablet (25 mg total) by mouth 2 (two) times daily.   No facility-administered encounter medications on file as of 02/02/2024.    Surgical History: Past Surgical History:  Procedure Laterality Date   BILATERAL SALPINGECTOMY Bilateral 12/11/2018   Procedure: BILATERAL SALPINGECTOMY;  Surgeon: Schermerhorn, Debby PARAS, MD;  Location: [facility];  Service: Gynecology;  Laterality: Bilateral;   CHOLECYSTECTOMY N/A 12/03/2015   Procedure: LAPAROSCOPIC  CHOLECYSTECTOMY WITH INTRAOPERATIVE CHOLANGIOGRAM;  Surgeon: Louanne KANDICE Muse, MD;  Location: [facility];  Service: General;  Laterality: N/A;   TONSILLECTOMY     VAGINAL HYSTERECTOMY N/A 12/11/2018   Procedure: HYSTERECTOMY VAGINAL;  Surgeon: Lovetta Debby PARAS, MD;  Location: [facility];  Service: Gynecology;  Laterality: N/A;   WISDOM TOOTH EXTRACTION      Medical History: Past Medical History:  Diagnosis Date   GERD (gastroesophageal reflux disease)    Hypertension    Hypothyroidism    Thyroid  disease     Family History: Family History  Problem Relation Age of Onset   Melanoma Mother    Hypertension Mother    Diabetes Father    Hyperthyroidism Father     Social History   Socioeconomic History   Marital status: Married    Spouse name: Not on file   Number of children: Not on file   Years of education: Not on file   Highest education level: Not on file  Occupational History   Not on file  Tobacco Use   Smoking status: Never   Smokeless tobacco: Never  Vaping Use   Vaping status: Never Used  Substance and Sexual Activity   Alcohol use: Yes    Comment: [facility] WINE   Drug use: No   Sexual activity: Not on file  Other Topics Concern   Not on file  Social History Narrative   Not on file   Social Drivers of Health   Tobacco Use: Low Risk (02/02/2024)   Patient History    Smoking Tobacco  Use: Never    Smokeless Tobacco Use: Never    Passive Exposure: Not on file  Financial Resource Strain: Not on file  Food Insecurity: Not on file  Transportation Needs: Not on file  Physical Activity: Not on file  Stress: Not on file  Social Connections: Not on file  Intimate Partner Violence: Not on file  Depression (PHQ2-9): Low Risk (02/02/2024)   Depression (PHQ2-9)    PHQ-2 Score: 0  Alcohol Screen: Low Risk (11/21/2023)   Alcohol Screen    Last Alcohol Screening Score (AUDIT): 2  Housing: Not on file  Utilities: Not on file  Health Literacy: Not on file       Review of Systems  Constitutional:  Negative for chills, fatigue and unexpected weight change.  HENT:  Negative for congestion, postnasal drip, rhinorrhea, sneezing and sore throat.   Eyes:  Negative for redness.  Respiratory:  Negative for cough, chest tightness and shortness of breath.   Cardiovascular:  Negative for chest pain and palpitations.  Gastrointestinal:  Negative for abdominal pain, constipation, diarrhea, nausea and vomiting.  Genitourinary:  Negative for dysuria and frequency.  Musculoskeletal:  Negative for arthralgias, back pain, joint swelling and neck pain.  Skin:  Negative for rash.  Neurological: Negative.  Negative for tremors and numbness.  Hematological:  Negative for adenopathy. Does not bruise/bleed easily.  Psychiatric/Behavioral:  Negative for behavioral problems (Depression), sleep disturbance and suicidal ideas. The patient is not nervous/anxious.     Vital Signs: BP 110/65   Pulse 83   Temp 98 F (36.7 C)   Resp 16   Ht 5' 3 (1.6 m)   Wt 213 lb (96.6 kg)   SpO2 99%   BMI 37.73 kg/m    Physical Exam Vitals and nursing note reviewed.  Constitutional:      Appearance: Normal appearance.  HENT:     Head: Normocephalic and atraumatic.     Mouth/Throat:     Mouth: Mucous membranes are moist.     Pharynx: Oropharynx is clear.  Eyes:     Pupils: Pupils are equal, round, and reactive to light.  Cardiovascular:     Rate and Rhythm: Normal rate and regular rhythm.     Heart sounds: No murmur heard. Neurological:     Mental Status: She is alert and oriented to person, place, and time.  Psychiatric:        Mood and Affect: Mood normal.        Behavior: Behavior normal.        Assessment/Plan: 1. Lymph node enlargement (Primary) Per thyroid  US  likely mildly prominent lymph nodes inferior to left lobe recommending repeat US  in 3 months therefore will order soft tissue US  for further evaluation in 2-3 months time - US  Soft Tissue  Head/Neck (NON-THYROID ); Future  2. Acquired hypothyroidism Continue synthroid  1.5 tabs daily, labs already ordered for monitoring   General Counseling: Mesha verbalizes understanding of the findings of todays visit and agrees with plan of treatment. I have discussed any further diagnostic evaluation that may be needed or ordered today. We also reviewed her medications today. she has been encouraged to call the office with any questions or concerns that should arise related to todays visit.    Orders Placed This Encounter  Procedures   US  Soft Tissue Head/Neck (NON-THYROID )    No orders of the defined types were placed in this encounter.   This patient was seen by Tinnie Pro, PA-C in collaboration with Dr. Sigrid Bathe as a part  of collaborative care agreement.   Total time spent:25 Minutes Time spent includes review of chart, medications, test results, and follow up plan with the patient.      Dr Fozia M Khan Internal medicine

## 2024-05-02 ENCOUNTER — Ambulatory Visit

## 2024-05-24 ENCOUNTER — Encounter: Admitting: Physician Assistant

## 2024-07-26 ENCOUNTER — Ambulatory Visit: Admitting: Dermatology
